# Patient Record
Sex: Female | Born: 1970
Health system: Southern US, Community
[De-identification: ages and names within clinical notes are randomized; demographics above are authoritative.]

## PROBLEM LIST (undated history)

## (undated) DIAGNOSIS — Z87442 Personal history of urinary calculi: Secondary | ICD-10-CM

## (undated) DIAGNOSIS — M1990 Unspecified osteoarthritis, unspecified site: Secondary | ICD-10-CM

## (undated) DIAGNOSIS — G4730 Sleep apnea, unspecified: Secondary | ICD-10-CM

## (undated) DIAGNOSIS — I10 Essential (primary) hypertension: Secondary | ICD-10-CM

## (undated) DIAGNOSIS — Z889 Allergy status to unspecified drugs, medicaments and biological substances status: Secondary | ICD-10-CM

## (undated) DIAGNOSIS — J45909 Unspecified asthma, uncomplicated: Secondary | ICD-10-CM

## (undated) DIAGNOSIS — R519 Headache, unspecified: Secondary | ICD-10-CM

## (undated) DIAGNOSIS — F32A Depression, unspecified: Secondary | ICD-10-CM

## (undated) DIAGNOSIS — F419 Anxiety disorder, unspecified: Secondary | ICD-10-CM

## (undated) HISTORY — PX: NO PAST SURGERIES: SHX2092

---

## 1998-03-19 ENCOUNTER — Encounter: Admission: RE | Admit: 1998-03-19 | Discharge: 1998-06-17 | Payer: Self-pay | Admitting: Family Medicine

## 1998-12-06 ENCOUNTER — Other Ambulatory Visit: Admission: RE | Admit: 1998-12-06 | Discharge: 1998-12-06 | Payer: Self-pay | Admitting: Obstetrics and Gynecology

## 2000-03-25 ENCOUNTER — Other Ambulatory Visit: Admission: RE | Admit: 2000-03-25 | Discharge: 2000-03-25 | Payer: Self-pay | Admitting: Obstetrics and Gynecology

## 2001-03-16 ENCOUNTER — Other Ambulatory Visit: Admission: RE | Admit: 2001-03-16 | Discharge: 2001-03-16 | Payer: Self-pay | Admitting: *Deleted

## 2001-05-05 ENCOUNTER — Emergency Department (HOSPITAL_COMMUNITY): Admission: EM | Admit: 2001-05-05 | Discharge: 2001-05-05 | Payer: Self-pay | Admitting: Emergency Medicine

## 2001-05-12 ENCOUNTER — Encounter (INDEPENDENT_AMBULATORY_CARE_PROVIDER_SITE_OTHER): Payer: Self-pay | Admitting: Specialist

## 2001-05-12 ENCOUNTER — Ambulatory Visit (HOSPITAL_COMMUNITY): Admission: RE | Admit: 2001-05-12 | Discharge: 2001-05-12 | Payer: Self-pay | Admitting: *Deleted

## 2001-06-27 ENCOUNTER — Encounter: Admission: RE | Admit: 2001-06-27 | Discharge: 2001-06-27 | Payer: Self-pay | Admitting: Internal Medicine

## 2001-06-27 ENCOUNTER — Encounter: Payer: Self-pay | Admitting: Internal Medicine

## 2001-07-08 ENCOUNTER — Encounter: Admission: RE | Admit: 2001-07-08 | Discharge: 2001-07-08 | Payer: Self-pay | Admitting: Urology

## 2001-07-08 ENCOUNTER — Encounter: Payer: Self-pay | Admitting: Urology

## 2002-03-17 ENCOUNTER — Other Ambulatory Visit: Admission: RE | Admit: 2002-03-17 | Discharge: 2002-03-17 | Payer: Self-pay | Admitting: Obstetrics and Gynecology

## 2003-06-13 ENCOUNTER — Other Ambulatory Visit: Admission: RE | Admit: 2003-06-13 | Discharge: 2003-06-13 | Payer: Self-pay | Admitting: Gynecology

## 2004-06-23 ENCOUNTER — Other Ambulatory Visit: Admission: RE | Admit: 2004-06-23 | Discharge: 2004-06-23 | Payer: Self-pay | Admitting: Gynecology

## 2005-09-17 ENCOUNTER — Other Ambulatory Visit: Admission: RE | Admit: 2005-09-17 | Discharge: 2005-09-17 | Payer: Self-pay | Admitting: Obstetrics and Gynecology

## 2006-03-09 ENCOUNTER — Inpatient Hospital Stay (HOSPITAL_COMMUNITY): Admission: AD | Admit: 2006-03-09 | Discharge: 2006-03-09 | Payer: Self-pay | Admitting: Obstetrics and Gynecology

## 2006-03-27 ENCOUNTER — Inpatient Hospital Stay (HOSPITAL_COMMUNITY): Admission: AD | Admit: 2006-03-27 | Discharge: 2006-03-29 | Payer: Self-pay | Admitting: Obstetrics and Gynecology

## 2008-05-12 ENCOUNTER — Encounter: Admission: RE | Admit: 2008-05-12 | Discharge: 2008-05-12 | Payer: Self-pay | Admitting: Family Medicine

## 2008-08-17 ENCOUNTER — Ambulatory Visit: Payer: Self-pay

## 2008-08-17 ENCOUNTER — Encounter (INDEPENDENT_AMBULATORY_CARE_PROVIDER_SITE_OTHER): Payer: Self-pay | Admitting: Family Medicine

## 2011-02-06 NOTE — Procedures (Signed)
[place]. [facility]  Patient:    Veronica Meyer, Veronica Meyer Visit Number: [id number] MRN: [id number]          Service Type: END Location: ENDO Attending Physician:  Sabino Gasser Proc. Date: 05/12/01 Adm. Date:  05/12/2001                             Procedure Report  PROCEDURE PERFORMED:  Colonoscopy.  ENDOSCOPIST:  Sabino Gasser, M.D.  INDICATIONS FOR PROCEDURE:  Rectal bleeding colitic symptoms.  ANESTHESIA:  Demerol 100 mg, Versed 10 mg.  DESCRIPTION OF PROCEDURE:  With the patient mildly sedated in the left lateral decubitus position, the Olympus videoscopic colonoscope was inserted in the rectum and passed under direct vision into the cecum.  The cecum was identified by the ileocecal valve and appendiceal orifice, both of which were photographed.  We entered into the terminal ileum which showed inflammatory changes distally, but after a few centimeters, the mucosa became normal.  From this point, the colonoscope was slowly withdrawn, taking circumferential views of the terminal ileum and colonic mucosa stopping first to biopsy in the terminal ileum, subsequently then in the cecum, ascending, descending, rectosigmoid area and rectum areas that were moderately inflamed.  There was a question of pseudomembrane seen in the cecum only.  The endoscope was then further withdrawn all the way to the rectum and on retroflex view this area actually appeared relatively normal although still somewhat edematous.  The endoscope was straightened and withdrawn.  Patients vital signs and pulse oximeter remained stable.  The patient tolerated the procedure well and without apparent complications.  FINDINGS:  Diffuse colitis and ileitis.  PLAN:  Will institute therapy with Flagyl, await biopsy report.  The patient will call me for results and follow-up with me as an outpatient. Attending Physician:  Sabino Gasser DD:  05/12/01 TD:  05/13/01 Job: [id number]

## 2012-10-13 ENCOUNTER — Other Ambulatory Visit (HOSPITAL_COMMUNITY)
Admission: RE | Admit: 2012-10-13 | Discharge: 2012-10-13 | Disposition: A | Discharge status: Home / Self Care | Payer: Managed Care, Other (non HMO) | Source: Ambulatory Visit | Attending: Family Medicine | Admitting: Family Medicine

## 2012-10-13 ENCOUNTER — Other Ambulatory Visit: Payer: Self-pay | Admitting: Family Medicine

## 2012-10-13 DIAGNOSIS — Z1151 Encounter for screening for human papillomavirus (HPV): Secondary | ICD-10-CM | POA: Insufficient documentation

## 2012-10-13 DIAGNOSIS — Z124 Encounter for screening for malignant neoplasm of cervix: Secondary | ICD-10-CM | POA: Insufficient documentation

## 2013-07-26 ENCOUNTER — Other Ambulatory Visit: Payer: Self-pay

## 2013-07-26 DIAGNOSIS — Z1231 Encounter for screening mammogram for malignant neoplasm of breast: Secondary | ICD-10-CM

## 2013-07-31 ENCOUNTER — Ambulatory Visit
Admission: RE | Admit: 2013-07-31 | Discharge: 2013-07-31 | Disposition: A | Discharge status: Home / Self Care | Payer: Managed Care, Other (non HMO) | Source: Ambulatory Visit

## 2013-07-31 DIAGNOSIS — Z1231 Encounter for screening mammogram for malignant neoplasm of breast: Secondary | ICD-10-CM

## 2013-08-04 ENCOUNTER — Other Ambulatory Visit: Payer: Self-pay | Admitting: Family Medicine

## 2013-08-04 DIAGNOSIS — R928 Other abnormal and inconclusive findings on diagnostic imaging of breast: Secondary | ICD-10-CM

## 2013-08-14 ENCOUNTER — Ambulatory Visit
Admission: RE | Admit: 2013-08-14 | Discharge: 2013-08-14 | Disposition: A | Discharge status: Home / Self Care | Payer: Managed Care, Other (non HMO) | Source: Ambulatory Visit | Attending: Family Medicine | Admitting: Family Medicine

## 2013-08-14 DIAGNOSIS — R928 Other abnormal and inconclusive findings on diagnostic imaging of breast: Secondary | ICD-10-CM

## 2014-10-23 ENCOUNTER — Other Ambulatory Visit: Payer: Self-pay

## 2014-10-23 DIAGNOSIS — Z1231 Encounter for screening mammogram for malignant neoplasm of breast: Secondary | ICD-10-CM

## 2014-10-31 ENCOUNTER — Ambulatory Visit: Payer: Managed Care, Other (non HMO)

## 2014-11-01 ENCOUNTER — Ambulatory Visit
Admission: RE | Admit: 2014-11-01 | Discharge: 2014-11-01 | Disposition: A | Discharge status: Home / Self Care | Payer: 59 | Source: Ambulatory Visit

## 2014-11-01 DIAGNOSIS — Z1231 Encounter for screening mammogram for malignant neoplasm of breast: Secondary | ICD-10-CM

## 2015-01-25 ENCOUNTER — Other Ambulatory Visit (HOSPITAL_COMMUNITY)
Admission: RE | Admit: 2015-01-25 | Discharge: 2015-01-25 | Disposition: A | Discharge status: Home / Self Care | Payer: 59 | Source: Ambulatory Visit | Attending: Family Medicine | Admitting: Family Medicine

## 2015-01-25 ENCOUNTER — Other Ambulatory Visit (HOSPITAL_COMMUNITY): Payer: Self-pay | Admitting: Family Medicine

## 2015-01-25 DIAGNOSIS — Z1151 Encounter for screening for human papillomavirus (HPV): Secondary | ICD-10-CM | POA: Diagnosis present

## 2015-01-25 DIAGNOSIS — Z124 Encounter for screening for malignant neoplasm of cervix: Secondary | ICD-10-CM | POA: Insufficient documentation

## 2015-01-28 LAB — CYTOLOGY - PAP

## 2016-05-04 ENCOUNTER — Other Ambulatory Visit: Payer: Self-pay | Admitting: Family Medicine

## 2016-05-04 DIAGNOSIS — Z1231 Encounter for screening mammogram for malignant neoplasm of breast: Secondary | ICD-10-CM

## 2016-05-13 ENCOUNTER — Ambulatory Visit
Admission: RE | Admit: 2016-05-13 | Discharge: 2016-05-13 | Disposition: A | Discharge status: Home / Self Care | Payer: Managed Care, Other (non HMO) | Source: Ambulatory Visit | Attending: Family Medicine | Admitting: Family Medicine

## 2016-05-13 DIAGNOSIS — Z1231 Encounter for screening mammogram for malignant neoplasm of breast: Secondary | ICD-10-CM

## 2017-04-29 ENCOUNTER — Other Ambulatory Visit: Payer: Self-pay | Admitting: Family Medicine

## 2017-04-29 DIAGNOSIS — N938 Other specified abnormal uterine and vaginal bleeding: Secondary | ICD-10-CM

## 2017-05-04 ENCOUNTER — Other Ambulatory Visit: Payer: Self-pay | Admitting: Family Medicine

## 2017-05-07 ENCOUNTER — Ambulatory Visit
Admission: RE | Admit: 2017-05-07 | Discharge: 2017-05-07 | Disposition: A | Discharge status: Home / Self Care | Payer: Managed Care, Other (non HMO) | Source: Ambulatory Visit | Attending: Family Medicine | Admitting: Family Medicine

## 2017-05-07 DIAGNOSIS — N938 Other specified abnormal uterine and vaginal bleeding: Secondary | ICD-10-CM

## 2017-05-27 ENCOUNTER — Other Ambulatory Visit: Payer: Managed Care, Other (non HMO)

## 2017-06-21 ENCOUNTER — Other Ambulatory Visit: Payer: Self-pay | Admitting: Family Medicine

## 2017-06-21 DIAGNOSIS — Z1231 Encounter for screening mammogram for malignant neoplasm of breast: Secondary | ICD-10-CM

## 2017-07-07 ENCOUNTER — Ambulatory Visit
Admission: RE | Admit: 2017-07-07 | Discharge: 2017-07-07 | Disposition: A | Discharge status: Home / Self Care | Payer: Managed Care, Other (non HMO) | Source: Ambulatory Visit | Attending: Family Medicine | Admitting: Family Medicine

## 2017-07-07 DIAGNOSIS — Z1231 Encounter for screening mammogram for malignant neoplasm of breast: Secondary | ICD-10-CM

## 2019-07-28 DIAGNOSIS — Z0000 Encounter for general adult medical examination without abnormal findings: Secondary | ICD-10-CM | POA: Diagnosis not present

## 2019-07-28 DIAGNOSIS — F410 Panic disorder [episodic paroxysmal anxiety] without agoraphobia: Secondary | ICD-10-CM | POA: Diagnosis not present

## 2019-07-28 DIAGNOSIS — F3342 Major depressive disorder, recurrent, in full remission: Secondary | ICD-10-CM | POA: Diagnosis not present

## 2019-07-28 DIAGNOSIS — N951 Menopausal and female climacteric states: Secondary | ICD-10-CM | POA: Diagnosis not present

## 2019-07-31 DIAGNOSIS — Z13220 Encounter for screening for lipoid disorders: Secondary | ICD-10-CM | POA: Diagnosis not present

## 2019-07-31 DIAGNOSIS — E785 Hyperlipidemia, unspecified: Secondary | ICD-10-CM | POA: Diagnosis not present

## 2019-07-31 DIAGNOSIS — Z23 Encounter for immunization: Secondary | ICD-10-CM | POA: Diagnosis not present

## 2019-07-31 DIAGNOSIS — Z131 Encounter for screening for diabetes mellitus: Secondary | ICD-10-CM | POA: Diagnosis not present

## 2020-01-30 DIAGNOSIS — D2261 Melanocytic nevi of right upper limb, including shoulder: Secondary | ICD-10-CM | POA: Diagnosis not present

## 2020-01-30 DIAGNOSIS — D225 Melanocytic nevi of trunk: Secondary | ICD-10-CM | POA: Diagnosis not present

## 2020-01-30 DIAGNOSIS — D2271 Melanocytic nevi of right lower limb, including hip: Secondary | ICD-10-CM | POA: Diagnosis not present

## 2020-01-30 DIAGNOSIS — D2262 Melanocytic nevi of left upper limb, including shoulder: Secondary | ICD-10-CM | POA: Diagnosis not present

## 2020-04-08 ENCOUNTER — Other Ambulatory Visit: Payer: Self-pay | Admitting: Family Medicine

## 2020-04-08 DIAGNOSIS — Z1231 Encounter for screening mammogram for malignant neoplasm of breast: Secondary | ICD-10-CM

## 2020-04-12 ENCOUNTER — Ambulatory Visit
Admission: RE | Admit: 2020-04-12 | Discharge: 2020-04-12 | Disposition: A | Discharge status: Home / Self Care | Payer: BC Managed Care – PPO | Source: Ambulatory Visit | Attending: Family Medicine | Admitting: Family Medicine

## 2020-04-12 ENCOUNTER — Other Ambulatory Visit: Payer: Self-pay

## 2020-04-12 DIAGNOSIS — Z1231 Encounter for screening mammogram for malignant neoplasm of breast: Secondary | ICD-10-CM | POA: Diagnosis not present

## 2020-08-08 DIAGNOSIS — J309 Allergic rhinitis, unspecified: Secondary | ICD-10-CM | POA: Diagnosis not present

## 2020-08-08 DIAGNOSIS — F3342 Major depressive disorder, recurrent, in full remission: Secondary | ICD-10-CM | POA: Diagnosis not present

## 2020-08-08 DIAGNOSIS — Z23 Encounter for immunization: Secondary | ICD-10-CM | POA: Diagnosis not present

## 2020-08-08 DIAGNOSIS — Z0000 Encounter for general adult medical examination without abnormal findings: Secondary | ICD-10-CM | POA: Diagnosis not present

## 2020-08-08 DIAGNOSIS — E785 Hyperlipidemia, unspecified: Secondary | ICD-10-CM | POA: Diagnosis not present

## 2020-08-08 DIAGNOSIS — N951 Menopausal and female climacteric states: Secondary | ICD-10-CM | POA: Diagnosis not present

## 2020-08-08 DIAGNOSIS — F410 Panic disorder [episodic paroxysmal anxiety] without agoraphobia: Secondary | ICD-10-CM | POA: Diagnosis not present

## 2020-08-08 DIAGNOSIS — Z124 Encounter for screening for malignant neoplasm of cervix: Secondary | ICD-10-CM | POA: Diagnosis not present

## 2020-10-22 DIAGNOSIS — D225 Melanocytic nevi of trunk: Secondary | ICD-10-CM | POA: Diagnosis not present

## 2020-10-22 DIAGNOSIS — D224 Melanocytic nevi of scalp and neck: Secondary | ICD-10-CM | POA: Diagnosis not present

## 2020-10-22 DIAGNOSIS — D2239 Melanocytic nevi of other parts of face: Secondary | ICD-10-CM | POA: Diagnosis not present

## 2020-10-22 DIAGNOSIS — D2262 Melanocytic nevi of left upper limb, including shoulder: Secondary | ICD-10-CM | POA: Diagnosis not present

## 2021-03-13 DIAGNOSIS — U071 COVID-19: Secondary | ICD-10-CM | POA: Diagnosis not present

## 2021-04-22 DIAGNOSIS — R030 Elevated blood-pressure reading, without diagnosis of hypertension: Secondary | ICD-10-CM | POA: Diagnosis not present

## 2021-04-22 DIAGNOSIS — Z23 Encounter for immunization: Secondary | ICD-10-CM | POA: Diagnosis not present

## 2021-04-22 DIAGNOSIS — T2220XA Burn of second degree of shoulder and upper limb, except wrist and hand, unspecified site, initial encounter: Secondary | ICD-10-CM | POA: Diagnosis not present

## 2021-04-22 DIAGNOSIS — R0683 Snoring: Secondary | ICD-10-CM | POA: Diagnosis not present

## 2021-04-22 DIAGNOSIS — R400 Somnolence: Secondary | ICD-10-CM | POA: Diagnosis not present

## 2021-05-12 DIAGNOSIS — F410 Panic disorder [episodic paroxysmal anxiety] without agoraphobia: Secondary | ICD-10-CM | POA: Diagnosis not present

## 2021-05-12 DIAGNOSIS — G4719 Other hypersomnia: Secondary | ICD-10-CM | POA: Diagnosis not present

## 2021-05-13 ENCOUNTER — Other Ambulatory Visit: Payer: Self-pay | Admitting: Family Medicine

## 2021-05-13 DIAGNOSIS — Z1231 Encounter for screening mammogram for malignant neoplasm of breast: Secondary | ICD-10-CM

## 2021-05-29 DIAGNOSIS — G4733 Obstructive sleep apnea (adult) (pediatric): Secondary | ICD-10-CM | POA: Diagnosis not present

## 2021-05-29 DIAGNOSIS — E669 Obesity, unspecified: Secondary | ICD-10-CM | POA: Diagnosis not present

## 2021-05-29 DIAGNOSIS — R030 Elevated blood-pressure reading, without diagnosis of hypertension: Secondary | ICD-10-CM | POA: Diagnosis not present

## 2021-06-23 ENCOUNTER — Ambulatory Visit
Admission: RE | Admit: 2021-06-23 | Discharge: 2021-06-23 | Disposition: A | Discharge status: Home / Self Care | Payer: BC Managed Care – PPO | Source: Ambulatory Visit

## 2021-06-23 ENCOUNTER — Other Ambulatory Visit: Payer: Self-pay

## 2021-06-23 DIAGNOSIS — Z1231 Encounter for screening mammogram for malignant neoplasm of breast: Secondary | ICD-10-CM

## 2021-07-12 DIAGNOSIS — S61210A Laceration without foreign body of right index finger without damage to nail, initial encounter: Secondary | ICD-10-CM | POA: Diagnosis not present

## 2021-07-17 DIAGNOSIS — G4733 Obstructive sleep apnea (adult) (pediatric): Secondary | ICD-10-CM | POA: Diagnosis not present

## 2021-07-24 DIAGNOSIS — G4733 Obstructive sleep apnea (adult) (pediatric): Secondary | ICD-10-CM | POA: Diagnosis not present

## 2021-08-17 DIAGNOSIS — G4733 Obstructive sleep apnea (adult) (pediatric): Secondary | ICD-10-CM | POA: Diagnosis not present

## 2021-08-19 DIAGNOSIS — Z0000 Encounter for general adult medical examination without abnormal findings: Secondary | ICD-10-CM | POA: Diagnosis not present

## 2021-08-19 DIAGNOSIS — F410 Panic disorder [episodic paroxysmal anxiety] without agoraphobia: Secondary | ICD-10-CM | POA: Diagnosis not present

## 2021-08-19 DIAGNOSIS — E785 Hyperlipidemia, unspecified: Secondary | ICD-10-CM | POA: Diagnosis not present

## 2021-08-19 DIAGNOSIS — N938 Other specified abnormal uterine and vaginal bleeding: Secondary | ICD-10-CM | POA: Diagnosis not present

## 2021-08-19 DIAGNOSIS — F3341 Major depressive disorder, recurrent, in partial remission: Secondary | ICD-10-CM | POA: Diagnosis not present

## 2021-09-03 DIAGNOSIS — G4733 Obstructive sleep apnea (adult) (pediatric): Secondary | ICD-10-CM | POA: Diagnosis not present

## 2021-09-03 DIAGNOSIS — R030 Elevated blood-pressure reading, without diagnosis of hypertension: Secondary | ICD-10-CM | POA: Diagnosis not present

## 2021-09-16 DIAGNOSIS — G4733 Obstructive sleep apnea (adult) (pediatric): Secondary | ICD-10-CM | POA: Diagnosis not present

## 2021-10-08 DIAGNOSIS — Z79899 Other long term (current) drug therapy: Secondary | ICD-10-CM | POA: Diagnosis not present

## 2021-10-17 DIAGNOSIS — G4733 Obstructive sleep apnea (adult) (pediatric): Secondary | ICD-10-CM | POA: Diagnosis not present

## 2021-11-04 DIAGNOSIS — Z1211 Encounter for screening for malignant neoplasm of colon: Secondary | ICD-10-CM | POA: Diagnosis not present

## 2021-11-05 DIAGNOSIS — G4733 Obstructive sleep apnea (adult) (pediatric): Secondary | ICD-10-CM | POA: Diagnosis not present

## 2021-11-11 DIAGNOSIS — D2372 Other benign neoplasm of skin of left lower limb, including hip: Secondary | ICD-10-CM | POA: Diagnosis not present

## 2021-11-11 DIAGNOSIS — D2262 Melanocytic nevi of left upper limb, including shoulder: Secondary | ICD-10-CM | POA: Diagnosis not present

## 2021-11-11 DIAGNOSIS — L718 Other rosacea: Secondary | ICD-10-CM | POA: Diagnosis not present

## 2021-11-11 DIAGNOSIS — D2261 Melanocytic nevi of right upper limb, including shoulder: Secondary | ICD-10-CM | POA: Diagnosis not present

## 2021-11-17 DIAGNOSIS — G4733 Obstructive sleep apnea (adult) (pediatric): Secondary | ICD-10-CM | POA: Diagnosis not present

## 2022-01-05 DIAGNOSIS — G4733 Obstructive sleep apnea (adult) (pediatric): Secondary | ICD-10-CM | POA: Diagnosis not present

## 2022-01-14 DIAGNOSIS — I10 Essential (primary) hypertension: Secondary | ICD-10-CM | POA: Diagnosis not present

## 2022-01-14 DIAGNOSIS — E669 Obesity, unspecified: Secondary | ICD-10-CM | POA: Diagnosis not present

## 2022-01-14 DIAGNOSIS — Z6834 Body mass index (BMI) 34.0-34.9, adult: Secondary | ICD-10-CM | POA: Diagnosis not present

## 2022-01-14 DIAGNOSIS — F3342 Major depressive disorder, recurrent, in full remission: Secondary | ICD-10-CM | POA: Diagnosis not present

## 2022-03-16 DIAGNOSIS — G4733 Obstructive sleep apnea (adult) (pediatric): Secondary | ICD-10-CM | POA: Diagnosis not present

## 2022-03-16 DIAGNOSIS — E669 Obesity, unspecified: Secondary | ICD-10-CM | POA: Diagnosis not present

## 2022-04-02 DIAGNOSIS — F3342 Major depressive disorder, recurrent, in full remission: Secondary | ICD-10-CM | POA: Diagnosis not present

## 2022-04-02 DIAGNOSIS — I10 Essential (primary) hypertension: Secondary | ICD-10-CM | POA: Diagnosis not present

## 2022-04-02 DIAGNOSIS — R0602 Shortness of breath: Secondary | ICD-10-CM | POA: Diagnosis not present

## 2022-04-02 DIAGNOSIS — G4733 Obstructive sleep apnea (adult) (pediatric): Secondary | ICD-10-CM | POA: Diagnosis not present

## 2022-04-02 DIAGNOSIS — F17210 Nicotine dependence, cigarettes, uncomplicated: Secondary | ICD-10-CM | POA: Diagnosis not present

## 2022-04-27 DIAGNOSIS — M533 Sacrococcygeal disorders, not elsewhere classified: Secondary | ICD-10-CM | POA: Diagnosis not present

## 2022-04-27 DIAGNOSIS — M5416 Radiculopathy, lumbar region: Secondary | ICD-10-CM | POA: Diagnosis not present

## 2022-04-28 DIAGNOSIS — F322 Major depressive disorder, single episode, severe without psychotic features: Secondary | ICD-10-CM | POA: Diagnosis not present

## 2022-04-28 DIAGNOSIS — F17210 Nicotine dependence, cigarettes, uncomplicated: Secondary | ICD-10-CM | POA: Diagnosis not present

## 2022-04-28 DIAGNOSIS — I10 Essential (primary) hypertension: Secondary | ICD-10-CM | POA: Diagnosis not present

## 2022-04-28 DIAGNOSIS — E669 Obesity, unspecified: Secondary | ICD-10-CM | POA: Diagnosis not present

## 2022-05-05 DIAGNOSIS — M545 Low back pain, unspecified: Secondary | ICD-10-CM | POA: Diagnosis not present

## 2022-05-05 DIAGNOSIS — M25559 Pain in unspecified hip: Secondary | ICD-10-CM | POA: Diagnosis not present

## 2022-05-20 DIAGNOSIS — G4733 Obstructive sleep apnea (adult) (pediatric): Secondary | ICD-10-CM | POA: Diagnosis not present

## 2022-06-04 DIAGNOSIS — M545 Low back pain, unspecified: Secondary | ICD-10-CM | POA: Diagnosis not present

## 2022-06-04 DIAGNOSIS — M533 Sacrococcygeal disorders, not elsewhere classified: Secondary | ICD-10-CM | POA: Diagnosis not present

## 2022-06-16 ENCOUNTER — Other Ambulatory Visit: Payer: Self-pay | Admitting: Otolaryngology

## 2022-06-22 ENCOUNTER — Encounter (HOSPITAL_COMMUNITY): Payer: Self-pay | Admitting: Otolaryngology

## 2022-06-23 ENCOUNTER — Encounter (HOSPITAL_COMMUNITY): Payer: Self-pay | Admitting: Otolaryngology

## 2022-06-23 ENCOUNTER — Other Ambulatory Visit: Payer: Self-pay

## 2022-06-23 NOTE — Progress Notes (Signed)
PCP - Dr Theadore Nan Cardiologist - nn/a  Chest x-ray - n/a EKG - n/a Stress Test - n/a ECHO - 08/17/08 Cardiac Cath - n/a  ICD Pacemaker/Loop - n.a  Sleep Study -  Yes CPAP - does not use CPAP  ERAS: Clear liquids til 7:30 AM DOS.  STOP now taking any Aspirin (unless otherwise instructed by your surgeon), Aleve, Naproxen, Ibuprofen, Motrin, Advil, Goody's, BC's, all herbal medications, fish oil, and all vitamins.   Coronavirus Screening Do you have any of the following symptoms:  Cough yes/no: No Fever (>100.79F)  yes/no: No Runny nose yes/no: No Sore throat yes/no: No Difficulty breathing/shortness of breath  yes/no: No  Have you traveled in the last 14 days and where? yes/no: No  Patient verbalized understanding of instructions that were given via phone.

## 2022-06-24 ENCOUNTER — Ambulatory Visit (HOSPITAL_COMMUNITY): Payer: BC Managed Care – PPO | Admitting: Anesthesiology

## 2022-06-24 ENCOUNTER — Ambulatory Visit (HOSPITAL_COMMUNITY)
Admission: RE | Admit: 2022-06-24 | Discharge: 2022-06-24 | Disposition: A | Discharge status: Home / Self Care | Payer: BC Managed Care – PPO | Attending: Otolaryngology | Admitting: Otolaryngology

## 2022-06-24 ENCOUNTER — Encounter (HOSPITAL_COMMUNITY): Payer: Self-pay | Admitting: Otolaryngology

## 2022-06-24 ENCOUNTER — Encounter (HOSPITAL_COMMUNITY)
Admission: RE | Disposition: A | Discharge status: Home / Self Care | Payer: Self-pay | Source: Home / Self Care | Attending: Otolaryngology

## 2022-06-24 DIAGNOSIS — Z79899 Other long term (current) drug therapy: Secondary | ICD-10-CM | POA: Diagnosis not present

## 2022-06-24 DIAGNOSIS — F17210 Nicotine dependence, cigarettes, uncomplicated: Secondary | ICD-10-CM | POA: Diagnosis not present

## 2022-06-24 DIAGNOSIS — G4733 Obstructive sleep apnea (adult) (pediatric): Secondary | ICD-10-CM | POA: Diagnosis not present

## 2022-06-24 DIAGNOSIS — F418 Other specified anxiety disorders: Secondary | ICD-10-CM | POA: Insufficient documentation

## 2022-06-24 DIAGNOSIS — I10 Essential (primary) hypertension: Secondary | ICD-10-CM | POA: Diagnosis not present

## 2022-06-24 DIAGNOSIS — Z6832 Body mass index (BMI) 32.0-32.9, adult: Secondary | ICD-10-CM | POA: Insufficient documentation

## 2022-06-24 DIAGNOSIS — K219 Gastro-esophageal reflux disease without esophagitis: Secondary | ICD-10-CM | POA: Insufficient documentation

## 2022-06-24 DIAGNOSIS — J45990 Exercise induced bronchospasm: Secondary | ICD-10-CM | POA: Insufficient documentation

## 2022-06-24 DIAGNOSIS — E669 Obesity, unspecified: Secondary | ICD-10-CM | POA: Diagnosis not present

## 2022-06-24 HISTORY — DX: Personal history of urinary calculi: Z87.442

## 2022-06-24 HISTORY — DX: Unspecified osteoarthritis, unspecified site: M19.90

## 2022-06-24 HISTORY — DX: Essential (primary) hypertension: I10

## 2022-06-24 HISTORY — DX: Anxiety disorder, unspecified: F41.9

## 2022-06-24 HISTORY — DX: Unspecified asthma, uncomplicated: J45.909

## 2022-06-24 HISTORY — DX: Depression, unspecified: F32.A

## 2022-06-24 HISTORY — DX: Allergy status to unspecified drugs, medicaments and biological substances: Z88.9

## 2022-06-24 HISTORY — DX: Headache, unspecified: R51.9

## 2022-06-24 HISTORY — PX: DRUG INDUCED ENDOSCOPY: SHX6808

## 2022-06-24 HISTORY — DX: Sleep apnea, unspecified: G47.30

## 2022-06-24 LAB — CBC
HCT: 41.3 % (ref 36.0–46.0)
Hemoglobin: 14 g/dL (ref 12.0–15.0)
MCH: 31.7 pg (ref 26.0–34.0)
MCHC: 33.9 g/dL (ref 30.0–36.0)
MCV: 93.7 fL (ref 80.0–100.0)
Platelets: 240 10*3/uL (ref 150–400)
RBC: 4.41 MIL/uL (ref 3.87–5.11)
RDW: 13.6 % (ref 11.5–15.5)
WBC: 6.1 10*3/uL (ref 4.0–10.5)
nRBC: 0 % (ref 0.0–0.2)

## 2022-06-24 LAB — POCT PREGNANCY, URINE: Preg Test, Ur: NEGATIVE

## 2022-06-24 LAB — COMPREHENSIVE METABOLIC PANEL
ALT: 11 U/L (ref 0–44)
AST: 18 U/L (ref 15–41)
Albumin: 3.6 g/dL (ref 3.5–5.0)
Alkaline Phosphatase: 50 U/L (ref 38–126)
Anion gap: 11 (ref 5–15)
BUN: 10 mg/dL (ref 6–20)
CO2: 22 mmol/L (ref 22–32)
Calcium: 9.2 mg/dL (ref 8.9–10.3)
Chloride: 108 mmol/L (ref 98–111)
Creatinine, Ser: 0.81 mg/dL (ref 0.44–1.00)
GFR, Estimated: >60 mL/min (ref >60)
Glucose, Bld: 104 mg/dL — ABNORMAL HIGH (ref 70–99)
Potassium: 3.8 mmol/L (ref 3.5–5.1)
Sodium: 141 mmol/L (ref 135–145)
Total Bilirubin: 0.7 mg/dL (ref 0.3–1.2)
Total Protein: 6.9 g/dL (ref 6.5–8.1)

## 2022-06-24 SURGERY — DRUG INDUCED SLEEP ENDOSCOPY
Anesthesia: Monitor Anesthesia Care | Site: Nose

## 2022-06-24 MED ORDER — PROPOFOL 500 MG/50ML IV EMUL
INTRAVENOUS | Status: DC | PRN
  Administered 2022-06-24: 50 ug/kg/min via INTRAVENOUS

## 2022-06-24 MED ORDER — MEPERIDINE HCL 25 MG/ML IJ SOLN: 6.2500 mg | INTRAMUSCULAR | Status: DC | PRN

## 2022-06-24 MED ORDER — PROPOFOL 1000 MG/100ML IV EMUL
INTRAVENOUS | Status: AC
  Filled 2022-06-24: qty 100

## 2022-06-24 MED ORDER — LACTATED RINGERS IV SOLN: INTRAVENOUS | Status: DC

## 2022-06-24 MED ORDER — LIDOCAINE-EPINEPHRINE 1 %-1:100000 IJ SOLN
INTRAMUSCULAR | Status: AC
  Filled 2022-06-24: qty 1

## 2022-06-24 MED ORDER — OXYCODONE HCL 5 MG PO TABS: 5.0000 mg | ORAL_TABLET | Freq: Once | ORAL | Status: DC | PRN

## 2022-06-24 MED ORDER — MIDAZOLAM HCL 2 MG/2ML IJ SOLN: 0.5000 mg | Freq: Once | INTRAMUSCULAR | Status: DC | PRN

## 2022-06-24 MED ORDER — FENTANYL CITRATE (PF) 100 MCG/2ML IJ SOLN: 25.0000 ug | INTRAMUSCULAR | Status: DC | PRN

## 2022-06-24 MED ORDER — CHLORHEXIDINE GLUCONATE 0.12 % MT SOLN
15.0000 mL | Freq: Once | OROMUCOSAL | Status: AC
  Administered 2022-06-24: 15 mL via OROMUCOSAL
  Filled 2022-06-24: qty 15

## 2022-06-24 MED ORDER — ORAL CARE MOUTH RINSE: 15.0000 mL | Freq: Once | OROMUCOSAL | Status: AC

## 2022-06-24 MED ORDER — PROPOFOL 10 MG/ML IV BOLUS
INTRAVENOUS | Status: DC | PRN
  Administered 2022-06-24 (×2): 20 mg via INTRAVENOUS

## 2022-06-24 MED ORDER — OXYMETAZOLINE HCL 0.05 % NA SOLN
NASAL | Status: AC
  Filled 2022-06-24: qty 30

## 2022-06-24 MED ORDER — OXYCODONE HCL 5 MG/5ML PO SOLN: 5.0000 mg | Freq: Once | ORAL | Status: DC | PRN

## 2022-06-24 MED ORDER — PROMETHAZINE HCL 25 MG/ML IJ SOLN: 6.2500 mg | INTRAMUSCULAR | Status: DC | PRN

## 2022-06-24 MED ORDER — MIDAZOLAM HCL 2 MG/2ML IJ SOLN
INTRAMUSCULAR | Status: AC
  Filled 2022-06-24: qty 2

## 2022-06-24 SURGICAL SUPPLY — 15 items
BAG COUNTER SPONGE SURGICOUNT (BAG) IMPLANT
BAG SPNG CNTER NS LX DISP (BAG)
CANISTER SUCT 1200ML W/VALVE (MISCELLANEOUS) ×2 IMPLANT
GLOVE BIOGEL M 7.0 STRL (GLOVE) ×2 IMPLANT
KIT BASIN OR (CUSTOM PROCEDURE TRAY) ×2 IMPLANT
NDL PRECISIONGLIDE 27X1.5 (NEEDLE) IMPLANT
NEEDLE PRECISIONGLIDE 27X1.5 (NEEDLE) IMPLANT
PATTIES SURGICAL .5 X3 (DISPOSABLE) IMPLANT
SHEET MEDIUM DRAPE 40X70 STRL (DRAPES) ×2 IMPLANT
SOL ANTI FOG 6CC (MISCELLANEOUS) ×2 IMPLANT
SOLUTION ANTI FOG 6CC (MISCELLANEOUS) ×1
SPONGE NEURO XRAY DETECT 1X3 (DISPOSABLE) IMPLANT
SYR CONTROL 10ML LL (SYRINGE) IMPLANT
TOWEL GREEN STERILE FF (TOWEL DISPOSABLE) ×2 IMPLANT
TUBE CONNECTING 20X1/4 (TUBING) IMPLANT

## 2022-06-24 NOTE — Anesthesia Postprocedure Evaluation (Signed)
Anesthesia Post Note  Patient: Veronica Meyer  Procedure(s) Performed: DRUG INDUCED SLEEP ENDOSCOPY (Nose)     Patient location during evaluation: PACU Anesthesia Type: MAC Level of consciousness: awake and alert, patient cooperative and oriented Pain management: pain level controlled Vital Signs Assessment: post-procedure vital signs reviewed and stable Respiratory status: spontaneous breathing, nonlabored ventilation and respiratory function stable Cardiovascular status: stable and blood pressure returned to baseline Postop Assessment: no apparent nausea or vomiting and able to ambulate Anesthetic complications: no   No notable events documented.  Last Vitals:  Vitals:   06/24/22 1100 06/24/22 1115  BP: (!) 102/47 (!) 118/57  Pulse: 66 (!) 57  Resp: 20 17  Temp: 36.6 C 36.6 C  SpO2: 98% 97%    Last Pain:  Vitals:   06/24/22 1100  TempSrc:   PainSc: 0-No pain                 Ernestene Coover,E. Irene Mitcham

## 2022-06-24 NOTE — H&P (Signed)
[address] Veronica Meyer is an 51 y.o. female.   Chief Complaint: Obstructive sleep apnea HPI: History of moderately severe obstructive sleep apnea, unable to tolerate CPAP for long-term management.  Past Medical History:  Diagnosis Date   Anxiety    Arthritis    Asthma    exercise induced asthma - no inhaler   Depression    Headache    History of kidney stones    passed stones   History of seasonal allergies    Hypertension    Sleep apnea    does not use cpap    Past Surgical History:  Procedure Laterality Date   NO PAST SURGERIES      Family History  Problem Relation Age of Onset   Breast cancer Maternal Grandmother    Social History:  reports that she has been smoking cigarettes. She has a 8.50 pack-year smoking history. She has never used smokeless tobacco. She reports current alcohol use of about 9.0 standard drinks of alcohol per week. She reports current drug use.  Allergies:  Allergies  Allergen Reactions   Penicillins Rash    Childhood reaction.   Ambien [Zolpidem]     Paradoxical reaction   Pneumococcal Vaccines Hives and Other (See Comments)    Hives/"sensation of throat swelling up"    Medications Prior to Admission  Medication Sig Dispense Refill   acetaminophen (TYLENOL) 500 MG tablet Take 500-1,000 mg by mouth every 6 (six) hours as needed (pain.).     cetirizine (ZYRTEC) 10 MG tablet Take 10 mg by mouth in the morning.     clonazePAM (KLONOPIN) 0.5 MG tablet Take 0.5 mg by mouth at bedtime as needed (sleep).     diclofenac Sodium (VOLTAREN) 1 % GEL Apply 2 g topically 4 (four) times daily as needed (pain.).     fluticasone (FLONASE) 50 MCG/ACT nasal spray Place 1 spray into both nostrils daily in the afternoon.     losartan-hydrochlorothiazide (HYZAAR) 50-12.5 MG tablet Take 1 tablet by mouth in the morning.     omeprazole (PRILOSEC) 20 MG capsule Take 20 mg by mouth daily.     sertraline (ZOLOFT) 100 MG tablet Take 100 mg by mouth in the morning.       Results for orders placed or performed during the hospital encounter of 06/24/22 (from the past 48 hour(s))  CBC per protocol     Status: None   Collection Time: 06/24/22  8:42 AM  Result Value Ref Range   WBC 6.1 4.0 - 10.5 K/uL   RBC 4.41 3.87 - 5.11 MIL/uL   Hemoglobin 14.0 12.0 - 15.0 g/dL   HCT 41.3 36.0 - 46.0 %   MCV 93.7 80.0 - 100.0 fL   MCH 31.7 26.0 - 34.0 pg   MCHC 33.9 30.0 - 36.0 g/dL   RDW 13.6 11.5 - 15.5 %   Platelets 240 150 - 400 K/uL   nRBC 0.0 0.0 - 0.2 %    Comment: Performed at [facility], [place] [address] [place], [place] [address]  Comprehensive metabolic panel per protocol     Status: Abnormal   Collection Time: 06/24/22  8:42 AM  Result Value Ref Range   Sodium 141 135 - 145 mmol/L   Potassium 3.8 3.5 - 5.1 mmol/L   Chloride 108 98 - 111 mmol/L   CO2 22 22 - 32 mmol/L   Glucose, Bld 104 (H) 70 - 99 mg/dL    Comment: Glucose reference range applies only to samples taken  after fasting for at least 8 hours.   BUN 10 6 - 20 mg/dL   Creatinine, Ser 0.81 0.44 - 1.00 mg/dL   Calcium 9.2 8.9 - 10.3 mg/dL   Total Protein 6.9 6.5 - 8.1 g/dL   Albumin 3.6 3.5 - 5.0 g/dL   AST 18 15 - 41 U/L   ALT 11 0 - 44 U/L   Alkaline Phosphatase 50 38 - 126 U/L   Total Bilirubin 0.7 0.3 - 1.2 mg/dL   GFR, Estimated >60 >60 mL/min    Comment: (NOTE) Calculated using the CKD-EPI Creatinine Equation (2021)    Anion gap 11 5 - 15    Comment: Performed at [facility] [address] [place], [place] [address]  Pregnancy, urine POC     Status: None   Collection Time: 06/24/22  8:44 AM  Result Value Ref Range   Preg Test, Ur NEGATIVE NEGATIVE    Comment:        THE SENSITIVITY OF THIS METHODOLOGY IS >24 mIU/mL    No results found.  Review of Systems  Constitutional: Negative.   Respiratory:  Positive for apnea.     Blood pressure 119/77, pulse 75, temperature 98.1 F (36.7 C), temperature source Oral, resp. rate 18, height 5\' 3"  (1.6 m), weight  83.9 kg, last menstrual period 05/14/2022, SpO2 96 %. Physical Exam Constitutional:      Appearance: Normal appearance.  Cardiovascular:     Rate and Rhythm: Normal rate.  Pulmonary:     Effort: Pulmonary effort is normal.  Musculoskeletal:     Cervical back: Normal range of motion.  Neurological:     Mental Status: She is alert.      Assessment/Plan Patient admitted for outpatient surgery under general anesthesia: Drug-induced sleep endoscopy.  Jerrell Belfast, MD 06/24/2022, 10:05 AM

## 2022-06-24 NOTE — Op Note (Signed)
Operative Note: DRUG INDUCED SLEEP ENDOSCOPY  Patient: Veronica Meyer record number: [id number]  Date:06/24/2022  Pre-operative Indications: 1.  Obstructive Sleep Apnea  Postoperative Indications: Same  Surgical Procedure: 1.  Drug Induced Sleep Endoscopy (DISE)  Anesthesia: MAC with IV sedation  Surgeon: Delsa Bern, M.D.  Complications: None  BMI: 32.7 kg/m  EBL: None  Findings: There is no evidence of complete concentric palatal obstruction.  Anatomically the patient should be a candidate for hypoglossal nerve stimulation therapy.     Brief History: The patient is a 51 y.o. female with a history of obstructive sleep apnea. The patient has undergone previous sleep study which showed mild to moderate levels of obstructive sleep apnea.  The patient was prescribed CPAP which they consistently attempted to use without success.  Given the patient's history and findings, the above drug-induced sleep endoscopy was recommended to assess the patient's anatomic level of apnea.  Risks and benefits were discussed in detail with the patient today understand and agree with our plan for surgery which is scheduled at [facility] under sedated anesthesia as an outpatient.  Surgical Procedure: The patient is brought to the operating room on 06/24/2022 and placed in supine position on the operating table.  Intravenous sedated anesthesia was established without difficulty using the standard drug-induced sleep endoscopy protocol. When the patient was adequately anesthetized, surgical timeout was performed and correct identification of the patient and the surgical procedure.    A propofol infusion was administered and the patient was monitored carefully to achieve a level of sedation appropriate for DISE.  The patient did not respond to verbal commands but still had spontaneous respiration, sleep disordered breathing and associated desaturations were observed.  With the patient  under adequate sedated anesthesia the flexible nasal laryngoscope was passed without difficulty.  The patient's nasal cavity showed deviated nasal septum and turbinate hypertrophy with partial obstruction.  The endoscope was then passed to visualize the velopharynx, oropharynx, tongue base and epiglottis to assess areas of obstruction.  Patient's airway showed anterior to posterior obstruction of the patient's soft palate and base of tongue.   There was no evidence of complete concentric palatal obstruction and the patient appeared to be a candidate anatomically for hypoglossal nerve stimulation therapy.  Surgical sponge count was correct. Patient was awakened from anesthetic and transferred from the operating room to the recovery room in stable condition. There were no complications and no blood loss.   Delsa Bern, M.D. [facility] 06/24/2022

## 2022-06-24 NOTE — Transfer of Care (Signed)
Immediate Anesthesia Transfer of Care Note  Patient: Veronica Meyer  Procedure(s) Performed: DRUG INDUCED SLEEP ENDOSCOPY (Nose)  Patient Location: PACU  Anesthesia Type:MAC  Level of Consciousness: awake, alert , oriented, and patient cooperative  Airway & Oxygen Therapy: Patient Spontanous Breathing and Patient connected to nasal cannula oxygen  Post-op Assessment: Report given to RN, Post -op Vital signs reviewed and stable, and Patient moving all extremities X 4  Post vital signs: Reviewed and stable  Last Vitals:  Vitals Value Taken Time  BP    Temp    Pulse 66 06/24/22 1100  Resp    SpO2 89 % 06/24/22 1100  Vitals shown include unvalidated device data.  Last Pain:  Vitals:   06/24/22 0832  TempSrc:   PainSc: 0-No pain         Complications: No notable events documented.

## 2022-06-24 NOTE — Anesthesia Preprocedure Evaluation (Addendum)
Anesthesia Evaluation  Patient identified by MRN, date of birth, ID band Patient awake    Reviewed: Allergy & Precautions, NPO status , Patient's Chart, lab work & pertinent test results  History of Anesthesia Complications Negative for: history of anesthetic complications  Airway Mallampati: II  TM Distance: >3 FB Neck ROM: Full    Dental  (+) Dental Advisory Given, Teeth Intact   Pulmonary sleep apnea (does not use CPAP) , Current Smoker and Patient abstained from smoking.,    breath sounds clear to auscultation       Cardiovascular hypertension, Pt. on medications (-) angina Rhythm:Regular Rate:Normal  '09 ECHO: EF 55-60%, normal LVF, normal RVF, no significant valvular abnormalities   Neuro/Psych  Headaches, Anxiety Depression    GI/Hepatic Neg liver ROS, GERD  Medicated and Controlled,  Endo/Other  BMI 32.8  Renal/GU negative Renal ROS     Musculoskeletal   Abdominal (+) + obese,   Peds  Hematology negative hematology ROS (+)   Anesthesia Other Findings   Reproductive/Obstetrics                            Anesthesia Physical Anesthesia Plan  ASA: 3  Anesthesia Plan: MAC   Post-op Pain Management: Minimal or no pain anticipated   Induction:   PONV Risk Score and Plan: 1 and Ondansetron  Airway Management Planned: Natural Airway  Additional Equipment: None  Intra-op Plan:   Post-operative Plan:   Informed Consent: I have reviewed the patients History and Physical, chart, labs and discussed the procedure including the risks, benefits and alternatives for the proposed anesthesia with the patient or authorized representative who has indicated his/her understanding and acceptance.     Dental advisory given  Plan Discussed with: CRNA and Surgeon  Anesthesia Plan Comments:        Anesthesia Quick Evaluation

## 2022-06-24 NOTE — Anesthesia Procedure Notes (Addendum)
Date/Time: 06/24/2022 10:41 AM  Performed by: Michele Rockers, CRNAPre-anesthesia Checklist: Patient identified, Emergency Drugs available, Suction available, Timeout performed and Patient being monitored Patient Re-evaluated:Patient Re-evaluated prior to induction Oxygen Delivery Method: Nasal Cannula

## 2022-06-25 ENCOUNTER — Encounter (HOSPITAL_COMMUNITY): Payer: Self-pay | Admitting: Otolaryngology

## 2022-06-25 DIAGNOSIS — M5459 Other low back pain: Secondary | ICD-10-CM | POA: Diagnosis not present

## 2022-06-29 DIAGNOSIS — M5459 Other low back pain: Secondary | ICD-10-CM | POA: Diagnosis not present

## 2022-07-03 DIAGNOSIS — M5459 Other low back pain: Secondary | ICD-10-CM | POA: Diagnosis not present

## 2022-07-06 DIAGNOSIS — M5459 Other low back pain: Secondary | ICD-10-CM | POA: Diagnosis not present

## 2022-07-13 DIAGNOSIS — M5459 Other low back pain: Secondary | ICD-10-CM | POA: Diagnosis not present

## 2022-07-15 DIAGNOSIS — M5459 Other low back pain: Secondary | ICD-10-CM | POA: Diagnosis not present

## 2022-07-20 DIAGNOSIS — M5459 Other low back pain: Secondary | ICD-10-CM | POA: Diagnosis not present

## 2022-07-24 ENCOUNTER — Other Ambulatory Visit: Payer: Self-pay | Admitting: Family Medicine

## 2022-07-24 DIAGNOSIS — Z1231 Encounter for screening mammogram for malignant neoplasm of breast: Secondary | ICD-10-CM

## 2022-07-24 DIAGNOSIS — M533 Sacrococcygeal disorders, not elsewhere classified: Secondary | ICD-10-CM | POA: Diagnosis not present

## 2022-08-27 DIAGNOSIS — F3342 Major depressive disorder, recurrent, in full remission: Secondary | ICD-10-CM | POA: Diagnosis not present

## 2022-08-27 DIAGNOSIS — G4733 Obstructive sleep apnea (adult) (pediatric): Secondary | ICD-10-CM | POA: Diagnosis not present

## 2022-08-27 DIAGNOSIS — F410 Panic disorder [episodic paroxysmal anxiety] without agoraphobia: Secondary | ICD-10-CM | POA: Diagnosis not present

## 2022-08-27 DIAGNOSIS — I10 Essential (primary) hypertension: Secondary | ICD-10-CM | POA: Diagnosis not present

## 2022-08-27 DIAGNOSIS — E785 Hyperlipidemia, unspecified: Secondary | ICD-10-CM | POA: Diagnosis not present

## 2022-08-27 DIAGNOSIS — Z0000 Encounter for general adult medical examination without abnormal findings: Secondary | ICD-10-CM | POA: Diagnosis not present

## 2022-08-27 DIAGNOSIS — N938 Other specified abnormal uterine and vaginal bleeding: Secondary | ICD-10-CM | POA: Diagnosis not present

## 2022-08-27 DIAGNOSIS — Z1159 Encounter for screening for other viral diseases: Secondary | ICD-10-CM | POA: Diagnosis not present

## 2022-08-27 DIAGNOSIS — Z23 Encounter for immunization: Secondary | ICD-10-CM | POA: Diagnosis not present

## 2022-09-02 DIAGNOSIS — G4733 Obstructive sleep apnea (adult) (pediatric): Secondary | ICD-10-CM | POA: Diagnosis not present

## 2022-09-03 DIAGNOSIS — M533 Sacrococcygeal disorders, not elsewhere classified: Secondary | ICD-10-CM | POA: Diagnosis not present

## 2022-09-03 DIAGNOSIS — N83202 Unspecified ovarian cyst, left side: Secondary | ICD-10-CM | POA: Diagnosis not present

## 2022-09-03 DIAGNOSIS — D251 Intramural leiomyoma of uterus: Secondary | ICD-10-CM | POA: Diagnosis not present

## 2022-09-03 DIAGNOSIS — N938 Other specified abnormal uterine and vaginal bleeding: Secondary | ICD-10-CM | POA: Diagnosis not present

## 2022-09-03 DIAGNOSIS — D252 Subserosal leiomyoma of uterus: Secondary | ICD-10-CM | POA: Diagnosis not present

## 2022-09-28 ENCOUNTER — Ambulatory Visit
Admission: RE | Admit: 2022-09-28 | Discharge: 2022-09-28 | Disposition: A | Discharge status: Home / Self Care | Payer: BC Managed Care – PPO | Source: Ambulatory Visit | Attending: Family Medicine | Admitting: Family Medicine

## 2022-09-28 DIAGNOSIS — Z1231 Encounter for screening mammogram for malignant neoplasm of breast: Secondary | ICD-10-CM

## 2022-09-30 DIAGNOSIS — M533 Sacrococcygeal disorders, not elsewhere classified: Secondary | ICD-10-CM | POA: Diagnosis not present

## 2022-10-07 ENCOUNTER — Other Ambulatory Visit: Payer: Self-pay | Admitting: Otolaryngology

## 2022-10-07 DIAGNOSIS — Z6832 Body mass index (BMI) 32.0-32.9, adult: Secondary | ICD-10-CM | POA: Diagnosis not present

## 2022-10-07 DIAGNOSIS — G4733 Obstructive sleep apnea (adult) (pediatric): Secondary | ICD-10-CM | POA: Diagnosis not present

## 2022-11-16 ENCOUNTER — Other Ambulatory Visit: Payer: Self-pay

## 2022-11-16 ENCOUNTER — Encounter (HOSPITAL_BASED_OUTPATIENT_CLINIC_OR_DEPARTMENT_OTHER): Payer: Self-pay | Admitting: Otolaryngology

## 2022-11-19 ENCOUNTER — Encounter (HOSPITAL_BASED_OUTPATIENT_CLINIC_OR_DEPARTMENT_OTHER)
Admission: RE | Admit: 2022-11-19 | Discharge: 2022-11-19 | Disposition: A | Discharge status: Home / Self Care | Payer: BC Managed Care – PPO | Source: Ambulatory Visit | Attending: Otolaryngology | Admitting: Otolaryngology

## 2022-11-19 DIAGNOSIS — Z79899 Other long term (current) drug therapy: Secondary | ICD-10-CM | POA: Insufficient documentation

## 2022-11-19 DIAGNOSIS — Z01812 Encounter for preprocedural laboratory examination: Secondary | ICD-10-CM | POA: Diagnosis not present

## 2022-11-19 LAB — BASIC METABOLIC PANEL
Anion gap: 11 (ref 5–15)
BUN: 12 mg/dL (ref 6–20)
CO2: 25 mmol/L (ref 22–32)
Calcium: 9.8 mg/dL (ref 8.9–10.3)
Chloride: 100 mmol/L (ref 98–111)
Creatinine, Ser: 0.87 mg/dL (ref 0.44–1.00)
GFR, Estimated: >60 mL/min (ref >60)
Glucose, Bld: 86 mg/dL (ref 70–99)
Potassium: 4.4 mmol/L (ref 3.5–5.1)
Sodium: 136 mmol/L (ref 135–145)

## 2022-11-19 NOTE — Progress Notes (Signed)

## 2022-11-24 ENCOUNTER — Other Ambulatory Visit: Payer: Self-pay

## 2022-11-24 ENCOUNTER — Ambulatory Visit (HOSPITAL_BASED_OUTPATIENT_CLINIC_OR_DEPARTMENT_OTHER): Payer: BC Managed Care – PPO | Admitting: Certified Registered"

## 2022-11-24 ENCOUNTER — Ambulatory Visit (HOSPITAL_COMMUNITY): Payer: BC Managed Care – PPO

## 2022-11-24 ENCOUNTER — Encounter (HOSPITAL_BASED_OUTPATIENT_CLINIC_OR_DEPARTMENT_OTHER): Payer: Self-pay | Admitting: Otolaryngology

## 2022-11-24 ENCOUNTER — Encounter (HOSPITAL_BASED_OUTPATIENT_CLINIC_OR_DEPARTMENT_OTHER)
Admission: RE | Disposition: A | Discharge status: Home / Self Care | Payer: Self-pay | Source: Ambulatory Visit | Attending: Otolaryngology

## 2022-11-24 ENCOUNTER — Ambulatory Visit (HOSPITAL_BASED_OUTPATIENT_CLINIC_OR_DEPARTMENT_OTHER)
Admission: RE | Admit: 2022-11-24 | Discharge: 2022-11-24 | Disposition: A | Discharge status: Home / Self Care | Payer: BC Managed Care – PPO | Source: Ambulatory Visit | Attending: Otolaryngology | Admitting: Otolaryngology

## 2022-11-24 DIAGNOSIS — F32A Depression, unspecified: Secondary | ICD-10-CM | POA: Insufficient documentation

## 2022-11-24 DIAGNOSIS — Z6832 Body mass index (BMI) 32.0-32.9, adult: Secondary | ICD-10-CM | POA: Insufficient documentation

## 2022-11-24 DIAGNOSIS — Z79899 Other long term (current) drug therapy: Secondary | ICD-10-CM | POA: Diagnosis not present

## 2022-11-24 DIAGNOSIS — J45909 Unspecified asthma, uncomplicated: Secondary | ICD-10-CM | POA: Insufficient documentation

## 2022-11-24 DIAGNOSIS — F17200 Nicotine dependence, unspecified, uncomplicated: Secondary | ICD-10-CM | POA: Diagnosis not present

## 2022-11-24 DIAGNOSIS — F17210 Nicotine dependence, cigarettes, uncomplicated: Secondary | ICD-10-CM | POA: Diagnosis not present

## 2022-11-24 DIAGNOSIS — K219 Gastro-esophageal reflux disease without esophagitis: Secondary | ICD-10-CM | POA: Insufficient documentation

## 2022-11-24 DIAGNOSIS — I10 Essential (primary) hypertension: Secondary | ICD-10-CM | POA: Diagnosis not present

## 2022-11-24 DIAGNOSIS — Z01818 Encounter for other preprocedural examination: Secondary | ICD-10-CM

## 2022-11-24 DIAGNOSIS — M1990 Unspecified osteoarthritis, unspecified site: Secondary | ICD-10-CM | POA: Diagnosis not present

## 2022-11-24 DIAGNOSIS — G4733 Obstructive sleep apnea (adult) (pediatric): Secondary | ICD-10-CM | POA: Diagnosis not present

## 2022-11-24 DIAGNOSIS — F419 Anxiety disorder, unspecified: Secondary | ICD-10-CM | POA: Insufficient documentation

## 2022-11-24 HISTORY — PX: IMPLANTATION OF HYPOGLOSSAL NERVE STIMULATOR: SHX6827

## 2022-11-24 LAB — POCT PREGNANCY, URINE: Preg Test, Ur: NEGATIVE

## 2022-11-24 SURGERY — INSERTION, HYPOGLOSSAL NERVE STIMULATOR
Anesthesia: General | Site: Neck | Laterality: Right

## 2022-11-24 MED ORDER — VANCOMYCIN HCL IN DEXTROSE 1-5 GM/200ML-% IV SOLN
INTRAVENOUS | Status: AC
  Filled 2022-11-24: qty 200

## 2022-11-24 MED ORDER — SCOPOLAMINE 1 MG/3DAYS TD PT72
MEDICATED_PATCH | TRANSDERMAL | Status: DC | PRN
  Administered 2022-11-24: 1 via TRANSDERMAL

## 2022-11-24 MED ORDER — AMISULPRIDE (ANTIEMETIC) 5 MG/2ML IV SOLN: 10.0000 mg | Freq: Once | INTRAVENOUS | Status: DC | PRN

## 2022-11-24 MED ORDER — 0.9 % SODIUM CHLORIDE (POUR BTL) OPTIME
TOPICAL | Status: DC | PRN
  Administered 2022-11-24: 200 mL

## 2022-11-24 MED ORDER — ONDANSETRON HCL 4 MG/2ML IJ SOLN: 4.0000 mg | Freq: Once | INTRAMUSCULAR | Status: DC | PRN

## 2022-11-24 MED ORDER — ONDANSETRON HCL 4 MG/2ML IJ SOLN
INTRAMUSCULAR | Status: DC | PRN
  Administered 2022-11-24: 4 mg via INTRAVENOUS

## 2022-11-24 MED ORDER — MIDAZOLAM HCL 5 MG/5ML IJ SOLN
INTRAMUSCULAR | Status: DC | PRN
  Administered 2022-11-24: 2 mg via INTRAVENOUS

## 2022-11-24 MED ORDER — OXYCODONE HCL 5 MG PO TABS
5.0000 mg | ORAL_TABLET | Freq: Once | ORAL | Status: AC | PRN
  Administered 2022-11-24: 5 mg via ORAL

## 2022-11-24 MED ORDER — PHENYLEPHRINE HCL-NACL 20-0.9 MG/250ML-% IV SOLN
INTRAVENOUS | Status: DC | PRN
  Administered 2022-11-24: 160 ug via INTRAVENOUS
  Administered 2022-11-24: 20 ug/min via INTRAVENOUS

## 2022-11-24 MED ORDER — ACETAMINOPHEN 500 MG PO TABS
1000.0000 mg | ORAL_TABLET | Freq: Once | ORAL | Status: AC
  Administered 2022-11-24: 1000 mg via ORAL

## 2022-11-24 MED ORDER — LIDOCAINE-EPINEPHRINE 1 %-1:100000 IJ SOLN
INTRAMUSCULAR | Status: AC
  Filled 2022-11-24: qty 1

## 2022-11-24 MED ORDER — PROPOFOL 500 MG/50ML IV EMUL
INTRAVENOUS | Status: DC | PRN
  Administered 2022-11-24: 50 ug/kg/min via INTRAVENOUS

## 2022-11-24 MED ORDER — DEXAMETHASONE SODIUM PHOSPHATE 4 MG/ML IJ SOLN
INTRAMUSCULAR | Status: DC | PRN
  Administered 2022-11-24: 4 mg via INTRAVENOUS

## 2022-11-24 MED ORDER — HYDROCODONE-ACETAMINOPHEN 5-325 MG PO TABS: 1.0000 | ORAL_TABLET | Freq: Four times a day (QID) | ORAL | 0 refills | Status: AC | PRN

## 2022-11-24 MED ORDER — ACETAMINOPHEN 500 MG PO TABS
ORAL_TABLET | ORAL | Status: AC
  Filled 2022-11-24: qty 2

## 2022-11-24 MED ORDER — PROPOFOL 500 MG/50ML IV EMUL
INTRAVENOUS | Status: AC
  Filled 2022-11-24: qty 50

## 2022-11-24 MED ORDER — OXYCODONE HCL 5 MG/5ML PO SOLN: 5.0000 mg | Freq: Once | ORAL | Status: AC | PRN

## 2022-11-24 MED ORDER — LIDOCAINE-EPINEPHRINE 1 %-1:100000 IJ SOLN
INTRAMUSCULAR | Status: DC | PRN
  Administered 2022-11-24: 6 mL

## 2022-11-24 MED ORDER — LACTATED RINGERS IV SOLN: INTRAVENOUS | Status: DC

## 2022-11-24 MED ORDER — DEXMEDETOMIDINE HCL IN NACL 80 MCG/20ML IV SOLN
INTRAVENOUS | Status: DC | PRN
  Administered 2022-11-24: 12 ug via BUCCAL

## 2022-11-24 MED ORDER — HYDROMORPHONE HCL 1 MG/ML IJ SOLN
0.2500 mg | INTRAMUSCULAR | Status: DC | PRN
  Administered 2022-11-24 (×2): 0.25 mg via INTRAVENOUS

## 2022-11-24 MED ORDER — VANCOMYCIN HCL IN DEXTROSE 1-5 GM/200ML-% IV SOLN
1000.0000 mg | INTRAVENOUS | Status: AC
  Administered 2022-11-24: 1000 mg via INTRAVENOUS

## 2022-11-24 MED ORDER — FENTANYL CITRATE (PF) 100 MCG/2ML IJ SOLN
INTRAMUSCULAR | Status: AC
  Filled 2022-11-24: qty 2

## 2022-11-24 MED ORDER — PROPOFOL 10 MG/ML IV BOLUS
INTRAVENOUS | Status: DC | PRN
  Administered 2022-11-24: 200 mg via INTRAVENOUS

## 2022-11-24 MED ORDER — SCOPOLAMINE 1 MG/3DAYS TD PT72
MEDICATED_PATCH | TRANSDERMAL | Status: AC
  Filled 2022-11-24: qty 1

## 2022-11-24 MED ORDER — HYDROMORPHONE HCL 1 MG/ML IJ SOLN
INTRAMUSCULAR | Status: AC
  Filled 2022-11-24: qty 0.5

## 2022-11-24 MED ORDER — OXYCODONE HCL 5 MG PO TABS
ORAL_TABLET | ORAL | Status: AC
  Filled 2022-11-24: qty 1

## 2022-11-24 MED ORDER — FENTANYL CITRATE (PF) 100 MCG/2ML IJ SOLN
INTRAMUSCULAR | Status: DC | PRN
  Administered 2022-11-24: 50 ug via INTRAVENOUS
  Administered 2022-11-24: 25 ug via INTRAVENOUS
  Administered 2022-11-24: 50 ug via INTRAVENOUS
  Administered 2022-11-24: 25 ug via INTRAVENOUS
  Administered 2022-11-24: 50 ug via INTRAVENOUS

## 2022-11-24 MED ORDER — SUCCINYLCHOLINE CHLORIDE 200 MG/10ML IV SOSY
PREFILLED_SYRINGE | INTRAVENOUS | Status: DC | PRN
  Administered 2022-11-24: 120 mg via INTRAVENOUS

## 2022-11-24 MED ORDER — LIDOCAINE 2% (20 MG/ML) 5 ML SYRINGE
INTRAMUSCULAR | Status: DC | PRN
  Administered 2022-11-24: 60 mg via INTRAVENOUS

## 2022-11-24 MED ORDER — MIDAZOLAM HCL 2 MG/2ML IJ SOLN
INTRAMUSCULAR | Status: AC
  Filled 2022-11-24: qty 2

## 2022-11-24 SURGICAL SUPPLY — 71 items
ACC NRSTM 4 TRQ WRNCH STRL (MISCELLANEOUS)
ADH SKN CLS APL DERMABOND .7 (GAUZE/BANDAGES/DRESSINGS) ×2
BLADE CLIPPER SURG (BLADE) IMPLANT
BLADE SURG 15 STRL LF DISP TIS (BLADE) ×2 IMPLANT
BLADE SURG 15 STRL SS (BLADE) ×1
CANISTER SUCT 1200ML W/VALVE (MISCELLANEOUS) ×2 IMPLANT
CORD BIPOLAR FORCEPS 12FT (ELECTRODE) ×2 IMPLANT
COVER PROBE CYLINDRICAL 5X96 (MISCELLANEOUS) ×2 IMPLANT
DERMABOND ADVANCED .7 DNX12 (GAUZE/BANDAGES/DRESSINGS) ×4 IMPLANT
DRAPE C-ARM 35X43 STRL (DRAPES) ×2 IMPLANT
DRAPE HEAD BAR (DRAPES) IMPLANT
DRAPE INCISE IOBAN 66X45 STRL (DRAPES) ×2 IMPLANT
DRAPE MICROSCOPE WILD 40.5X102 (DRAPES) ×2 IMPLANT
DRAPE UTILITY XL STRL (DRAPES) ×2 IMPLANT
DRSG TEGADERM 2-3/8X2-3/4 SM (GAUZE/BANDAGES/DRESSINGS) ×4 IMPLANT
DRSG TEGADERM 4X4.75 (GAUZE/BANDAGES/DRESSINGS) IMPLANT
ELECT COATED BLADE 2.86 ST (ELECTRODE) ×2 IMPLANT
ELECT EMG 18 NIMS (NEUROSURGERY SUPPLIES) ×1
ELECT REM PT RETURN 9FT ADLT (ELECTROSURGICAL) ×1
ELECTRODE EMG 18 NIMS (NEUROSURGERY SUPPLIES) ×2 IMPLANT
ELECTRODE REM PT RTRN 9FT ADLT (ELECTROSURGICAL) ×2 IMPLANT
FORCEPS BIPOLAR SPETZLER 8 1.0 (NEUROSURGERY SUPPLIES) ×2 IMPLANT
GAUZE 4X4 16PLY ~~LOC~~+RFID DBL (SPONGE) ×2 IMPLANT
GAUZE SPONGE 4X4 12PLY STRL (GAUZE/BANDAGES/DRESSINGS) ×2 IMPLANT
GENERATOR PULSE INSPIRE (Generator) ×1 IMPLANT
GENERATOR PULSE INSPIRE IV (Generator) ×2 IMPLANT
GLOVE BIO SURGEON STRL SZ 6.5 (GLOVE) IMPLANT
GLOVE BIO SURGEON STRL SZ7 (GLOVE) IMPLANT
GLOVE BIO SURGEON STRL SZ7.5 (GLOVE) ×2 IMPLANT
GLOVE BIOGEL PI IND STRL 7.0 (GLOVE) IMPLANT
GLOVE ECLIPSE 6.5 STRL STRAW (GLOVE) IMPLANT
GOWN STRL REUS W/ TWL LRG LVL3 (GOWN DISPOSABLE) ×6 IMPLANT
GOWN STRL REUS W/TWL LRG LVL3 (GOWN DISPOSABLE) ×4
IV CATH 18G SAFETY (IV SOLUTION) ×2 IMPLANT
KIT NEURO ACCESSORY W/WRENCH (MISCELLANEOUS) IMPLANT
LEAD SENSING RESP INSPIRE (Lead) ×1 IMPLANT
LEAD SENSING RESP INSPIRE IV (Lead) ×2 IMPLANT
LEAD SLEEP STIM INSPIRE IV/V (Lead) ×2 IMPLANT
LEAD SLEEP STIMULATION INSPIRE (Lead) ×1 IMPLANT
LOOP VASCLR MAXI BLUE 18IN ST (MISCELLANEOUS) ×2 IMPLANT
LOOP VASCULAR MAXI 18 BLUE (MISCELLANEOUS) ×1
LOOP VASCULAR MINI 18 RED (MISCELLANEOUS) ×1
LOOPS VASCLR MAXI BLUE 18IN ST (MISCELLANEOUS) ×1 IMPLANT
MARKER SKIN DUAL TIP RULER LAB (MISCELLANEOUS) ×2 IMPLANT
NDL HYPO 25X1 1.5 SAFETY (NEEDLE) ×2 IMPLANT
NEEDLE HYPO 25X1 1.5 SAFETY (NEEDLE) ×1 IMPLANT
NS IRRIG 1000ML POUR BTL (IV SOLUTION) ×2 IMPLANT
PACK BASIN DAY SURGERY FS (CUSTOM PROCEDURE TRAY) ×2 IMPLANT
PACK ENT DAY SURGERY (CUSTOM PROCEDURE TRAY) ×2 IMPLANT
PASSER CATH 36 CODMAN DISP (NEUROSURGERY SUPPLIES) IMPLANT
PASSER CATH 38CM DISP (INSTRUMENTS) IMPLANT
PENCIL SMOKE EVACUATOR (MISCELLANEOUS) ×2 IMPLANT
PROBE NERVE STIMULATOR (NEUROSURGERY SUPPLIES) ×2 IMPLANT
REMOTE CONTROL SLEEP INSPIRE (MISCELLANEOUS) ×2 IMPLANT
SET WALTER ACTIVATION W/DRAPE (SET/KITS/TRAYS/PACK) ×2 IMPLANT
SLEEVE SCD COMPRESS KNEE MED (STOCKING) ×2 IMPLANT
SPONGE INTESTINAL PEANUT (DISPOSABLE) ×2 IMPLANT
SUT SILK 2 0 SH (SUTURE) ×2 IMPLANT
SUT SILK 3 0 REEL (SUTURE) ×2 IMPLANT
SUT SILK 3 0 SH 30 (SUTURE) ×2 IMPLANT
SUT SILK 3-0 (SUTURE) ×1
SUT SILK 3-0 RB1 30XBRD (SUTURE) ×1
SUT VIC AB 3-0 SH 27 (SUTURE) ×2
SUT VIC AB 3-0 SH 27X BRD (SUTURE) ×4 IMPLANT
SUT VIC AB 4-0 PS2 27 (SUTURE) ×4 IMPLANT
SUTURE SILK 3-0 RB1 30XBRD (SUTURE) ×2 IMPLANT
SYR 10ML LL (SYRINGE) ×2 IMPLANT
SYR BULB EAR ULCER 3OZ GRN STR (SYRINGE) ×2 IMPLANT
TOWEL GREEN STERILE FF (TOWEL DISPOSABLE) ×4 IMPLANT
VASCULAR TIE MAXI BLUE 18IN ST (MISCELLANEOUS) ×1
VASCULAR TIE MINI RED 18IN STL (MISCELLANEOUS) ×2 IMPLANT

## 2022-11-24 NOTE — Transfer of Care (Signed)
Immediate Anesthesia Transfer of Care Note  Patient: Atley Cardone  Procedure(s) Performed: IMPLANTATION OF HYPOGLOSSAL NERVE STIMULATOR (Right: Neck)  Patient Location: PACU  Anesthesia Type:General  Level of Consciousness: awake, alert , oriented, and patient cooperative  Airway & Oxygen Therapy: Patient Spontanous Breathing and Patient connected to face mask oxygen  Post-op Assessment: Report given to RN and Post -op Vital signs reviewed and stable  Post vital signs: Reviewed and stable  Last Vitals:  Vitals Value Taken Time  BP    Temp    Pulse 93 11/24/22 1413  Resp 11 11/24/22 1413  SpO2 98 % 11/24/22 1413  Vitals shown include unvalidated device data.  Last Pain:  Vitals:   11/24/22 1047  TempSrc: Oral  PainSc: 0-No pain      Patients Stated Pain Goal: 3 ([id number])  Complications: No notable events documented.

## 2022-11-24 NOTE — Anesthesia Procedure Notes (Signed)
Procedure Name: Intubation Date/Time: 11/24/2022 12:20 PM  Performed by: Signe Colt, CRNAPre-anesthesia Checklist: Patient identified, Emergency Drugs available, Suction available and Patient being monitored Patient Re-evaluated:Patient Re-evaluated prior to induction Oxygen Delivery Method: Circle system utilized Preoxygenation: Pre-oxygenation with 100% oxygen Induction Type: IV induction Ventilation: Mask ventilation without difficulty Laryngoscope Size: Mac and 3 Grade View: Grade II Tube type: Oral Tube size: 7.0 mm Number of attempts: 1 Airway Equipment and Method: Stylet and Oral airway Placement Confirmation: ETT inserted through vocal cords under direct vision, positive ETCO2 and breath sounds checked- equal and bilateral Secured at: 21 cm Tube secured with: Tape Dental Injury: Teeth and Oropharynx as per pre-operative assessment

## 2022-11-24 NOTE — H&P (Signed)
[address] Veronica Meyer is an 52 y.o. female.   Chief Complaint: Sleep apnea HPI: 52 year old female with obstructive sleep apnea who has been unable to tolerate CPAP.  Past Medical History:  Diagnosis Date   Anxiety    Arthritis    Asthma    exercise induced asthma - no inhaler   Depression    Headache    History of kidney stones    passed stones   History of seasonal allergies    Hypertension    Sleep apnea    does not use cpap    Past Surgical History:  Procedure Laterality Date   DRUG INDUCED ENDOSCOPY N/A 06/24/2022   Procedure: DRUG INDUCED SLEEP ENDOSCOPY;  Surgeon: Jerrell Belfast, MD;  Location: [facility];  Service: ENT;  Laterality: N/A;   NO PAST SURGERIES      Family History  Problem Relation Age of Onset   Breast cancer Maternal Grandmother    Social History:  reports that she has been smoking cigarettes. She has a 8.50 pack-year smoking history. She quit smokeless tobacco use yesterday. She reports current alcohol use of about 9.0 standard drinks of alcohol per week. She reports that she does not use drugs.  Allergies:  Allergies  Allergen Reactions   Penicillins Rash    Childhood reaction.   Ambien [Zolpidem]     Paradoxical reaction   Pneumococcal Vaccines Hives and Other (See Comments)    Hives/"sensation of throat swelling up"    Medications Prior to Admission  Medication Sig Dispense Refill   acetaminophen (TYLENOL) 500 MG tablet Take 500-1,000 mg by mouth every 6 (six) hours as needed (pain.).     cetirizine (ZYRTEC) 10 MG tablet Take 10 mg by mouth in the morning.     losartan-hydrochlorothiazide (HYZAAR) 50-12.5 MG tablet Take 1 tablet by mouth in the morning.     omeprazole (PRILOSEC) 20 MG capsule Take 20 mg by mouth daily.     sertraline (ZOLOFT) 100 MG tablet Take 100 mg by mouth in the morning.     clonazePAM (KLONOPIN) 0.5 MG tablet Take 0.5 mg by mouth at bedtime as needed (sleep).     diclofenac Sodium (VOLTAREN) 1 % GEL Apply 2 g topically 4  (four) times daily as needed (pain.).     fluticasone (FLONASE) 50 MCG/ACT nasal spray Place 1 spray into both nostrils daily in the afternoon.      Results for orders placed or performed during the hospital encounter of 11/24/22 (from the past 48 hour(s))  Pregnancy, urine POC     Status: None   Collection Time: 11/24/22 10:43 AM  Result Value Ref Range   Preg Test, Ur NEGATIVE NEGATIVE    Comment:        THE SENSITIVITY OF THIS METHODOLOGY IS >24 mIU/mL    No results found.  Review of Systems  All other systems reviewed and are negative.   Blood pressure (!) 139/100, pulse 95, temperature (!) 97.1 F (36.2 C), temperature source Oral, resp. rate 20, height 5' 3.5" (1.613 m), weight 85.5 kg, last menstrual period 11/19/2022, SpO2 99 %. Physical Exam Constitutional:      Appearance: Normal appearance. She is normal weight.  HENT:     Head: Normocephalic and atraumatic.     Right Ear: External ear normal.     Left Ear: External ear normal.     Nose: Nose normal.     Mouth/Throat:     Mouth: Mucous membranes are moist.  Pharynx: Oropharynx is clear.  Eyes:     Extraocular Movements: Extraocular movements intact.     Conjunctiva/sclera: Conjunctivae normal.     Pupils: Pupils are equal, round, and reactive to light.  Cardiovascular:     Rate and Rhythm: Normal rate.  Pulmonary:     Effort: Pulmonary effort is normal.  Musculoskeletal:     Cervical back: Normal range of motion.  Skin:    General: Skin is warm and dry.  Neurological:     General: No focal deficit present.     Mental Status: She is alert and oriented to person, place, and time.  Psychiatric:        Mood and Affect: Mood normal.        Behavior: Behavior normal.        Thought Content: Thought content normal.        Judgment: Judgment normal.      Assessment/Plan Obstructive sleep apnea and BMI 32.87  To OR for hypoglossal nerve stimulator placement.  Melida Quitter, MD 11/24/2022, 12:03  PM

## 2022-11-24 NOTE — Anesthesia Preprocedure Evaluation (Addendum)
Anesthesia Evaluation  Patient identified by MRN, date of birth, ID band Patient awake    Reviewed: Allergy & Precautions, NPO status , Patient's Chart, lab work & pertinent test results  Airway Mallampati: III  TM Distance: >3 FB Neck ROM: Full  Mouth opening: Pediatric Airway  Dental no notable dental hx. (+) Teeth Intact, Dental Advisory Given   Pulmonary neg pulmonary ROS, asthma (exercise induced, well controlled) , sleep apnea , Current Smoker and Patient abstained from smoking. 10 cigg/d   Pulmonary exam normal breath sounds clear to auscultation       Cardiovascular hypertension (139/100 preop, normally slightly lower), Pt. on medications negative cardio ROS Normal cardiovascular exam Rhythm:Regular Rate:Normal     Neuro/Psych  Headaches PSYCHIATRIC DISORDERS Anxiety Depression    negative neurological ROS  negative psych ROS   GI/Hepatic negative GI ROS, Neg liver ROS,GERD  Medicated and Controlled,,  Endo/Other  negative endocrine ROS    Renal/GU negative Renal ROS  negative genitourinary   Musculoskeletal negative musculoskeletal ROS (+) Arthritis , Osteoarthritis,    Abdominal Normal abdominal exam  (+)   Peds negative pediatric ROS (+)  Hematology negative hematology ROS (+)   Anesthesia Other Findings   Reproductive/Obstetrics negative OB ROS                             Anesthesia Physical Anesthesia Plan  ASA: 3  Anesthesia Plan: General   Post-op Pain Management: Tylenol PO (pre-op)*   Induction: Intravenous  PONV Risk Score and Plan: 2 and Ondansetron, Dexamethasone, Midazolam, Treatment may vary due to age or medical condition, TIVA and Propofol infusion  Airway Management Planned: Oral ETT  Additional Equipment: None  Intra-op Plan:   Post-operative Plan: Extubation in OR  Informed Consent: I have reviewed the patients History and Physical, chart, labs and  discussed the procedure including the risks, benefits and alternatives for the proposed anesthesia with the patient or authorized representative who has indicated his/her understanding and acceptance.     Dental advisory given  Plan Discussed with: CRNA  Anesthesia Plan Comments:        Anesthesia Quick Evaluation

## 2022-11-24 NOTE — Anesthesia Postprocedure Evaluation (Signed)
Anesthesia Post Note  Patient: Veronica Meyer  Procedure(s) Performed: IMPLANTATION OF HYPOGLOSSAL NERVE STIMULATOR (Right: Neck)     Patient location during evaluation: PACU Anesthesia Type: General Level of consciousness: awake and alert, oriented and patient cooperative Pain management: pain level controlled Vital Signs Assessment: post-procedure vital signs reviewed and stable Respiratory status: spontaneous breathing, nonlabored ventilation and respiratory function stable Cardiovascular status: blood pressure returned to baseline and stable Postop Assessment: no apparent nausea or vomiting Anesthetic complications: no   No notable events documented.  Last Vitals:  Vitals:   11/24/22 1445 11/24/22 1500  BP: 134/75 131/63  Pulse: 73 78  Resp: 12 16  Temp:    SpO2: 94% 94%    Last Pain:  Vitals:   11/24/22 1500  TempSrc:   PainSc: [place]

## 2022-11-24 NOTE — Discharge Instructions (Signed)
No Tylenol until after 5:00pm today.  Post Anesthesia Home Care Instructions  Activity: Get plenty of rest for the remainder of the day. A responsible individual must stay with you for 24 hours following the procedure.  For the next 24 hours, DO NOT: -Drive a car -Paediatric nurse -Drink alcoholic beverages -Take any medication unless instructed by your physician -Make any legal decisions or sign important papers.  Meals: Start with liquid foods such as gelatin or soup. Progress to regular foods as tolerated. Avoid greasy, spicy, heavy foods. If nausea and/or vomiting occur, drink only clear liquids until the nausea and/or vomiting subsides. Call your physician if vomiting continues.  Special Instructions/Symptoms: Your throat may feel dry or sore from the anesthesia or the breathing tube placed in your throat during surgery. If this causes discomfort, gargle with warm salt water. The discomfort should disappear within 24 hours.  If you had a scopolamine patch placed behind your ear for the management of post- operative nausea and/or vomiting:  1. The medication in the patch is effective for 72 hours, after which it should be removed.  Wrap patch in a tissue and discard in the trash. Wash hands thoroughly with soap and water. 2. You may remove the patch earlier than 72 hours if you experience unpleasant side effects which may include dry mouth, dizziness or visual disturbances. 3. Avoid touching the patch. Wash your hands with soap and water after contact with the patch.

## 2022-11-24 NOTE — Brief Op Note (Signed)
11/24/2022  1:59 PM  PATIENT:  Veronica Meyer  52 y.o. female  PRE-OPERATIVE DIAGNOSIS:  Obstructive Sleep Apnea BMI 32.0-32.9,adult  POST-OPERATIVE DIAGNOSIS:  Obstructive Sleep Apnea  PROCEDURE:  Procedure(s): IMPLANTATION OF HYPOGLOSSAL NERVE STIMULATOR (Right)  SURGEON:  Surgeon(s) and Role:    Melida Quitter, MD - Primary  PHYSICIAN ASSISTANT:   ASSISTANTS: RNFA   ANESTHESIA:   general  EBL:  Minimal   BLOOD ADMINISTERED:none  DRAINS: none   LOCAL MEDICATIONS USED:  LIDOCAINE   SPECIMEN:  No Specimen  DISPOSITION OF SPECIMEN:  N/A  COUNTS:  YES  TOURNIQUET:  * No tourniquets in log *  DICTATION: .Note written in EPIC  PLAN OF CARE: Discharge to home after PACU  PATIENT DISPOSITION:  PACU - hemodynamically stable.   Delay start of Pharmacological VTE agent (>24hrs) due to surgical blood loss or risk of bleeding: no

## 2022-11-24 NOTE — Op Note (Signed)

## 2022-11-25 ENCOUNTER — Encounter (HOSPITAL_BASED_OUTPATIENT_CLINIC_OR_DEPARTMENT_OTHER): Payer: Self-pay | Admitting: Otolaryngology

## 2022-12-02 DIAGNOSIS — G4733 Obstructive sleep apnea (adult) (pediatric): Secondary | ICD-10-CM | POA: Diagnosis not present

## 2022-12-22 DIAGNOSIS — Z1211 Encounter for screening for malignant neoplasm of colon: Secondary | ICD-10-CM | POA: Diagnosis not present

## 2022-12-24 DIAGNOSIS — D2272 Melanocytic nevi of left lower limb, including hip: Secondary | ICD-10-CM | POA: Diagnosis not present

## 2022-12-24 DIAGNOSIS — L858 Other specified epidermal thickening: Secondary | ICD-10-CM | POA: Diagnosis not present

## 2022-12-24 DIAGNOSIS — D2261 Melanocytic nevi of right upper limb, including shoulder: Secondary | ICD-10-CM | POA: Diagnosis not present

## 2022-12-24 DIAGNOSIS — D225 Melanocytic nevi of trunk: Secondary | ICD-10-CM | POA: Diagnosis not present

## 2022-12-28 DIAGNOSIS — M533 Sacrococcygeal disorders, not elsewhere classified: Secondary | ICD-10-CM | POA: Diagnosis not present

## 2022-12-31 DIAGNOSIS — G4733 Obstructive sleep apnea (adult) (pediatric): Secondary | ICD-10-CM | POA: Diagnosis not present

## 2023-01-26 DIAGNOSIS — M533 Sacrococcygeal disorders, not elsewhere classified: Secondary | ICD-10-CM | POA: Diagnosis not present

## 2023-01-28 DIAGNOSIS — G4733 Obstructive sleep apnea (adult) (pediatric): Secondary | ICD-10-CM | POA: Diagnosis not present

## 2023-03-01 DIAGNOSIS — G4730 Sleep apnea, unspecified: Secondary | ICD-10-CM | POA: Diagnosis not present

## 2023-03-01 DIAGNOSIS — F3342 Major depressive disorder, recurrent, in full remission: Secondary | ICD-10-CM | POA: Diagnosis not present

## 2023-03-01 DIAGNOSIS — I10 Essential (primary) hypertension: Secondary | ICD-10-CM | POA: Diagnosis not present

## 2023-03-01 DIAGNOSIS — R5383 Other fatigue: Secondary | ICD-10-CM | POA: Diagnosis not present

## 2023-03-09 DIAGNOSIS — E669 Obesity, unspecified: Secondary | ICD-10-CM | POA: Diagnosis not present

## 2023-03-09 DIAGNOSIS — G4733 Obstructive sleep apnea (adult) (pediatric): Secondary | ICD-10-CM | POA: Diagnosis not present

## 2023-04-23 IMAGING — MG MM DIGITAL SCREENING BILAT W/ TOMO AND CAD
8 series · 8 of 24 positions shown · non-contrast
Comparison: Previous exam(s).

CLINICAL DATA: Screening.

EXAM:
DIGITAL SCREENING BILATERAL MAMMOGRAM WITH TOMOSYNTHESIS AND CAD
TECHNIQUE: Bilateral screening digital craniocaudal and mediolateral oblique
mammograms were obtained. Bilateral screening digital breast
tomosynthesis was performed. The images were evaluated with
computer-aided detection.

[R MLO synth-2D]
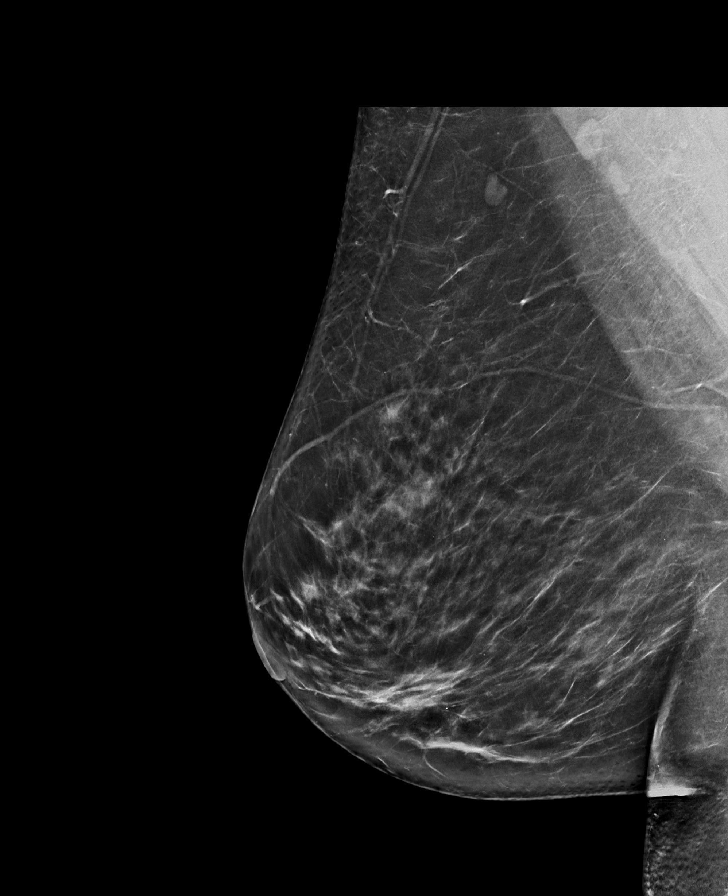

[R CC synth-2D]
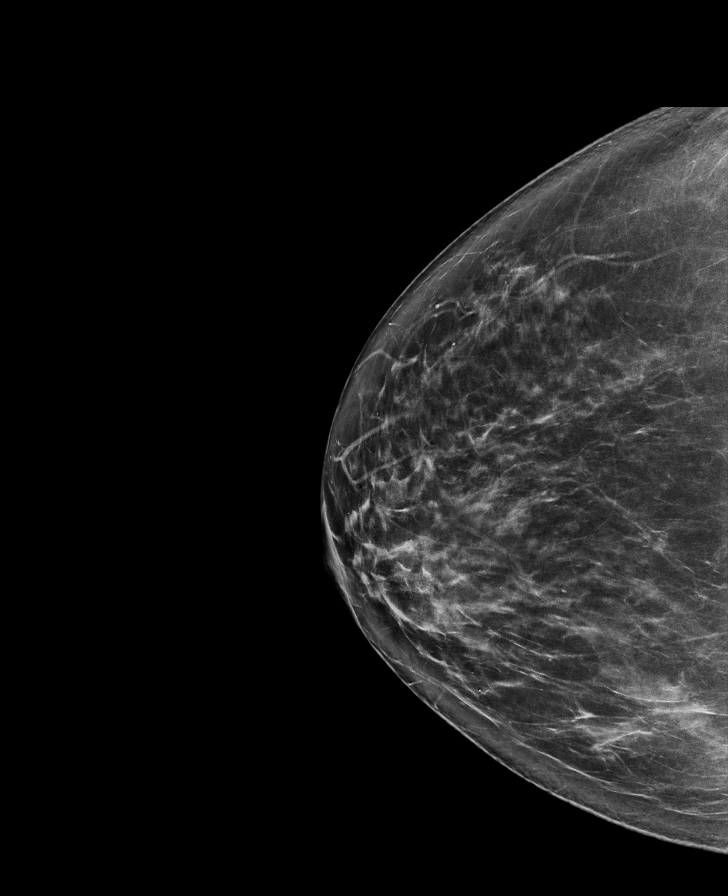

[L MLO synth-2D]
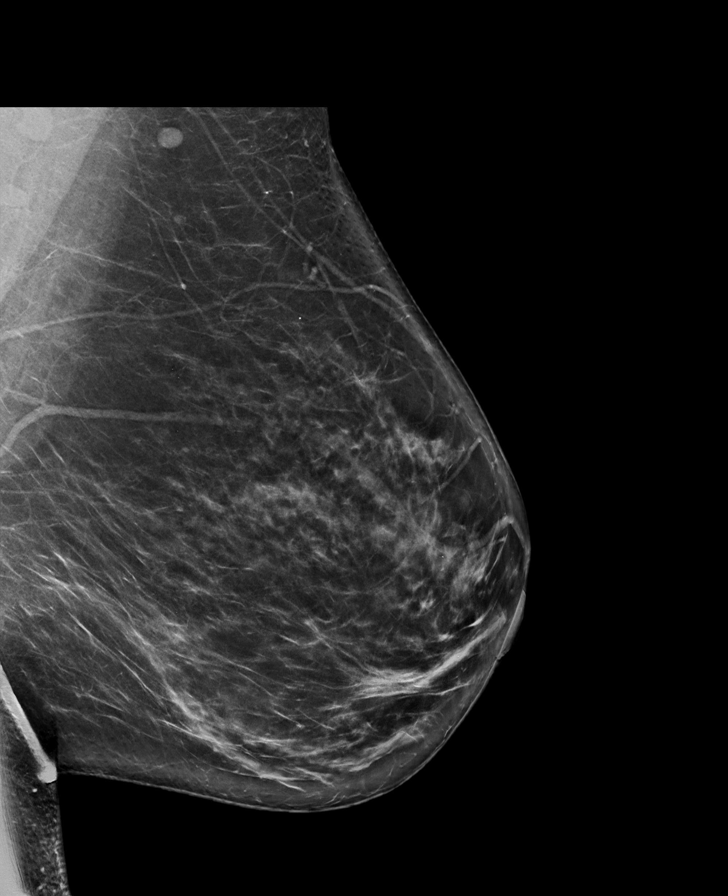

[L CC synth-2D]
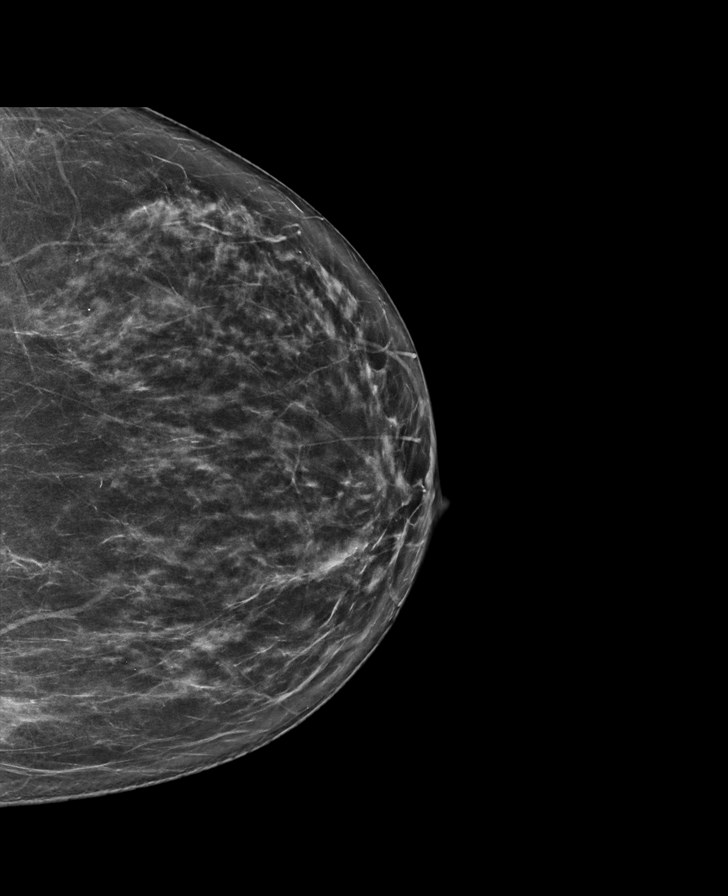

[R MLO tomo · tomo slice 43/86.0]
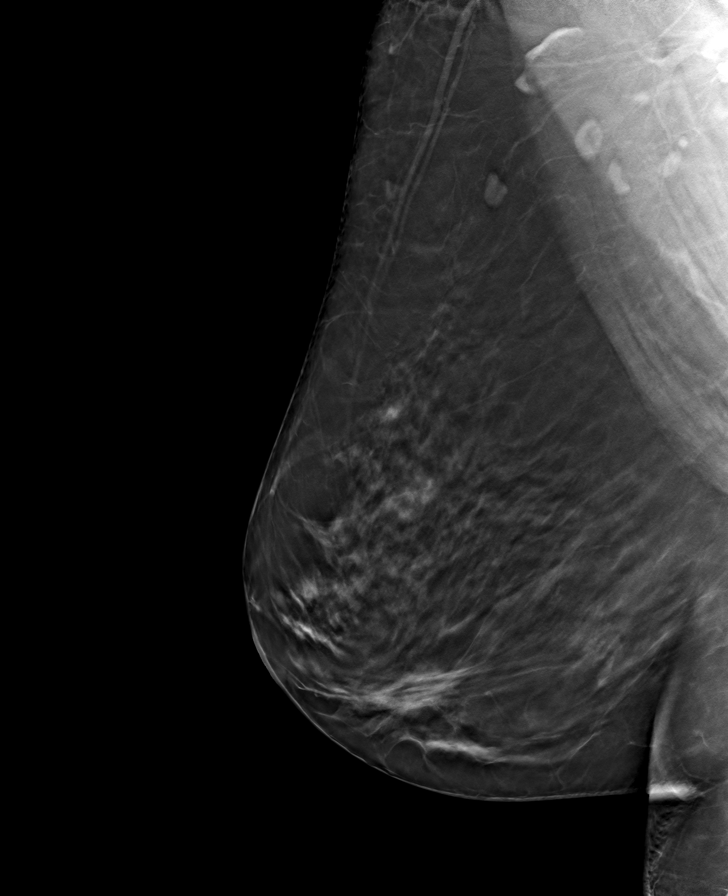

[L CC tomo · tomo slice 36/71.0]
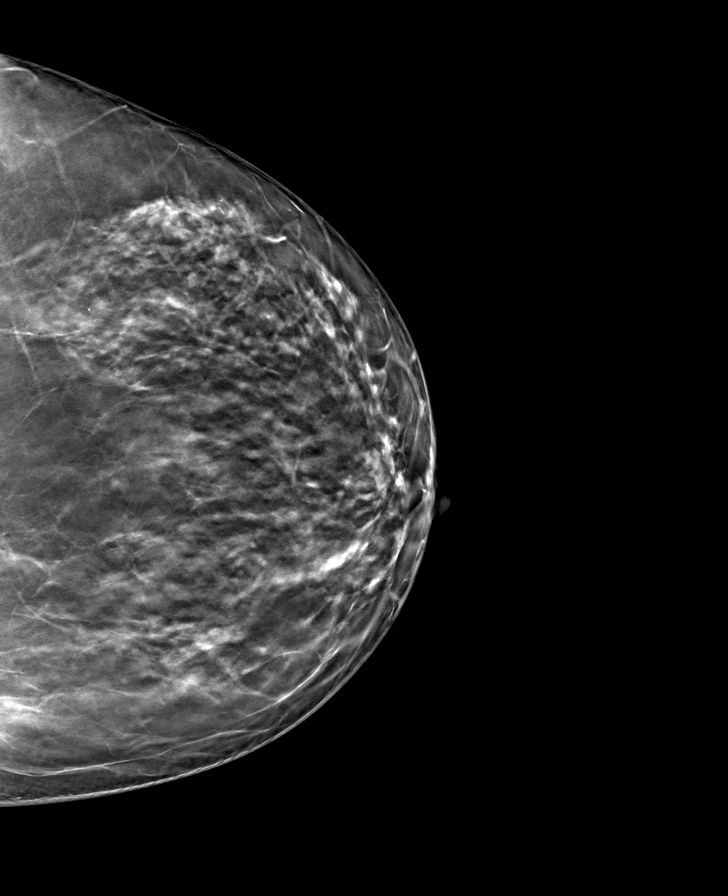

[L MLO tomo · tomo slice 45/88.0]
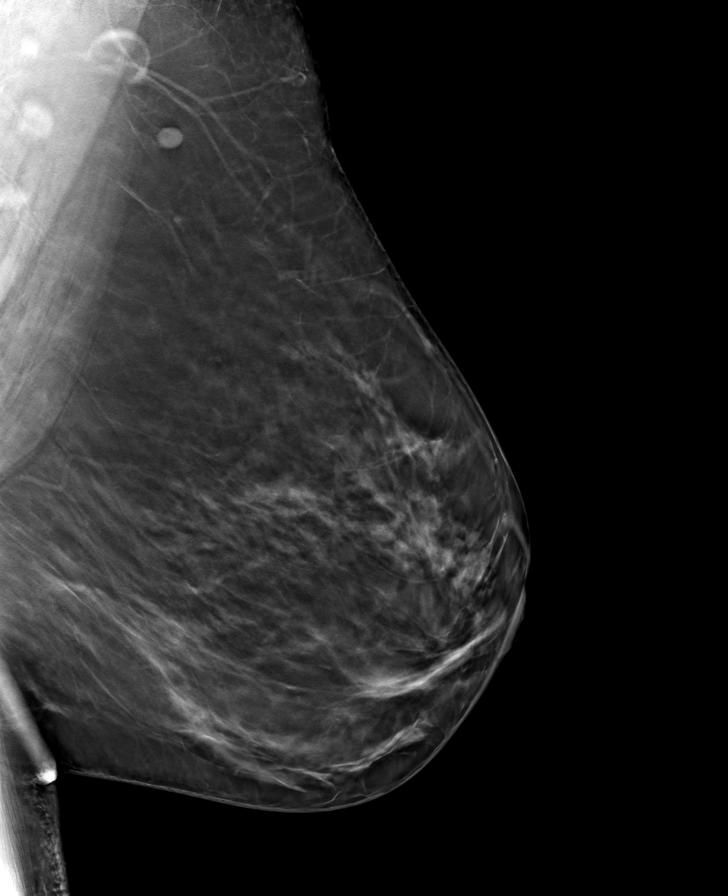

[R CC tomo · tomo slice 39/76.0]
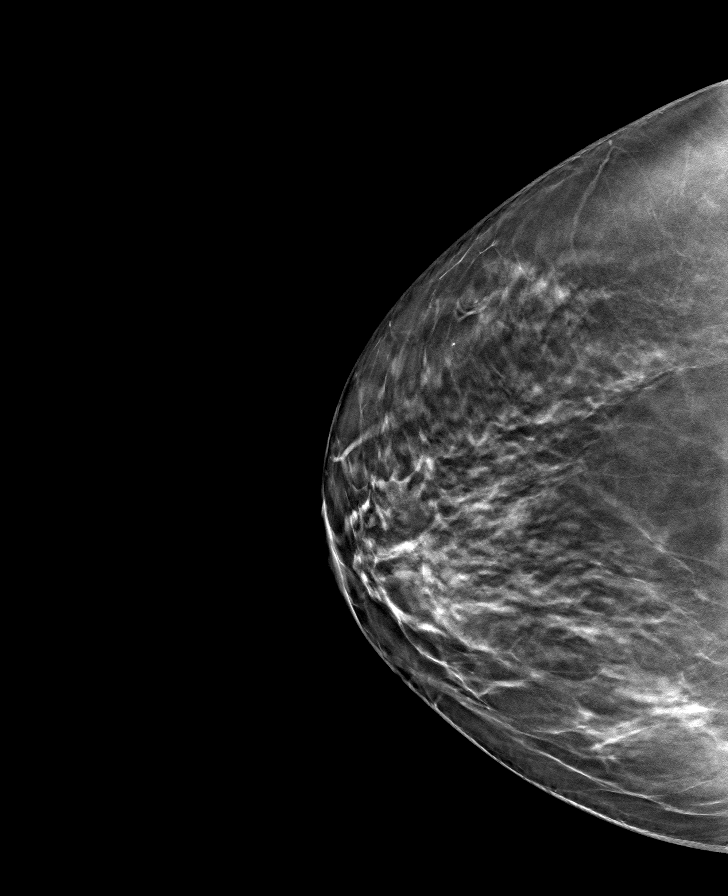

[8 of 24 positions shown; findings below may reference images not displayed]

ACR Breast Density Category b: There are scattered areas of
fibroglandular density.
FINDINGS: There are no findings suspicious for malignancy.
IMPRESSION: No mammographic evidence of malignancy. A result letter of this
screening mammogram will be mailed directly to the patient.

RECOMMENDATION:
Screening mammogram in one year. (Code:51-O-LD2)

BI-RADS CATEGORY  1: Negative.

## 2023-05-18 DIAGNOSIS — G4733 Obstructive sleep apnea (adult) (pediatric): Secondary | ICD-10-CM | POA: Diagnosis not present

## 2023-05-20 DIAGNOSIS — I10 Essential (primary) hypertension: Secondary | ICD-10-CM | POA: Diagnosis not present

## 2023-05-20 DIAGNOSIS — E669 Obesity, unspecified: Secondary | ICD-10-CM | POA: Diagnosis not present

## 2023-05-20 DIAGNOSIS — F5104 Psychophysiologic insomnia: Secondary | ICD-10-CM | POA: Diagnosis not present

## 2023-05-20 DIAGNOSIS — G4733 Obstructive sleep apnea (adult) (pediatric): Secondary | ICD-10-CM | POA: Diagnosis not present

## 2023-05-21 DIAGNOSIS — M533 Sacrococcygeal disorders, not elsewhere classified: Secondary | ICD-10-CM | POA: Diagnosis not present

## 2023-06-02 DIAGNOSIS — E559 Vitamin D deficiency, unspecified: Secondary | ICD-10-CM | POA: Diagnosis not present

## 2023-06-28 DIAGNOSIS — E669 Obesity, unspecified: Secondary | ICD-10-CM | POA: Diagnosis not present

## 2023-06-28 DIAGNOSIS — G4733 Obstructive sleep apnea (adult) (pediatric): Secondary | ICD-10-CM | POA: Diagnosis not present

## 2023-06-28 DIAGNOSIS — I10 Essential (primary) hypertension: Secondary | ICD-10-CM | POA: Diagnosis not present

## 2023-06-28 DIAGNOSIS — F5104 Psychophysiologic insomnia: Secondary | ICD-10-CM | POA: Diagnosis not present

## 2023-07-01 DIAGNOSIS — L723 Sebaceous cyst: Secondary | ICD-10-CM | POA: Diagnosis not present

## 2023-07-01 DIAGNOSIS — D225 Melanocytic nevi of trunk: Secondary | ICD-10-CM | POA: Diagnosis not present

## 2023-07-01 DIAGNOSIS — L910 Hypertrophic scar: Secondary | ICD-10-CM | POA: Diagnosis not present

## 2023-08-30 DIAGNOSIS — I10 Essential (primary) hypertension: Secondary | ICD-10-CM | POA: Diagnosis not present

## 2023-08-30 DIAGNOSIS — E559 Vitamin D deficiency, unspecified: Secondary | ICD-10-CM | POA: Diagnosis not present

## 2023-08-30 DIAGNOSIS — R946 Abnormal results of thyroid function studies: Secondary | ICD-10-CM | POA: Diagnosis not present

## 2023-08-30 DIAGNOSIS — E785 Hyperlipidemia, unspecified: Secondary | ICD-10-CM | POA: Diagnosis not present

## 2023-08-30 DIAGNOSIS — R7989 Other specified abnormal findings of blood chemistry: Secondary | ICD-10-CM | POA: Diagnosis not present

## 2023-09-01 ENCOUNTER — Other Ambulatory Visit (HOSPITAL_COMMUNITY): Payer: Self-pay | Admitting: Family Medicine

## 2023-09-01 DIAGNOSIS — F410 Panic disorder [episodic paroxysmal anxiety] without agoraphobia: Secondary | ICD-10-CM | POA: Diagnosis not present

## 2023-09-01 DIAGNOSIS — Z0000 Encounter for general adult medical examination without abnormal findings: Secondary | ICD-10-CM | POA: Diagnosis not present

## 2023-09-01 DIAGNOSIS — Z23 Encounter for immunization: Secondary | ICD-10-CM | POA: Diagnosis not present

## 2023-09-01 DIAGNOSIS — E785 Hyperlipidemia, unspecified: Secondary | ICD-10-CM

## 2023-09-01 DIAGNOSIS — F3341 Major depressive disorder, recurrent, in partial remission: Secondary | ICD-10-CM | POA: Diagnosis not present

## 2023-09-08 ENCOUNTER — Ambulatory Visit (HOSPITAL_COMMUNITY)
Admission: RE | Admit: 2023-09-08 | Discharge: 2023-09-08 | Disposition: A | Discharge status: Home / Self Care | Payer: Self-pay | Source: Ambulatory Visit | Attending: Family Medicine | Admitting: Family Medicine

## 2023-09-08 DIAGNOSIS — E785 Hyperlipidemia, unspecified: Secondary | ICD-10-CM | POA: Insufficient documentation

## 2023-09-23 DIAGNOSIS — M533 Sacrococcygeal disorders, not elsewhere classified: Secondary | ICD-10-CM | POA: Diagnosis not present

## 2023-09-30 ENCOUNTER — Other Ambulatory Visit: Payer: Self-pay | Admitting: Family Medicine

## 2023-09-30 DIAGNOSIS — Z1231 Encounter for screening mammogram for malignant neoplasm of breast: Secondary | ICD-10-CM

## 2023-10-08 ENCOUNTER — Ambulatory Visit
Admission: RE | Admit: 2023-10-08 | Discharge: 2023-10-08 | Disposition: A | Discharge status: Home / Self Care | Payer: BC Managed Care – PPO | Source: Ambulatory Visit | Attending: Family Medicine | Admitting: Family Medicine

## 2023-10-08 DIAGNOSIS — Z1231 Encounter for screening mammogram for malignant neoplasm of breast: Secondary | ICD-10-CM | POA: Diagnosis not present

## 2023-11-25 DIAGNOSIS — G4733 Obstructive sleep apnea (adult) (pediatric): Secondary | ICD-10-CM | POA: Diagnosis not present

## 2023-11-25 DIAGNOSIS — Z4542 Encounter for adjustment and management of neuropacemaker (brain) (peripheral nerve) (spinal cord): Secondary | ICD-10-CM | POA: Diagnosis not present

## 2023-11-25 DIAGNOSIS — F5104 Psychophysiologic insomnia: Secondary | ICD-10-CM | POA: Diagnosis not present

## 2023-12-15 DIAGNOSIS — M533 Sacrococcygeal disorders, not elsewhere classified: Secondary | ICD-10-CM | POA: Diagnosis not present

## 2023-12-23 DIAGNOSIS — F5104 Psychophysiologic insomnia: Secondary | ICD-10-CM | POA: Diagnosis not present

## 2023-12-23 DIAGNOSIS — G4733 Obstructive sleep apnea (adult) (pediatric): Secondary | ICD-10-CM | POA: Diagnosis not present

## 2023-12-23 DIAGNOSIS — Z4542 Encounter for adjustment and management of neuropacemaker (brain) (peripheral nerve) (spinal cord): Secondary | ICD-10-CM | POA: Diagnosis not present

## 2024-01-11 DIAGNOSIS — G4710 Hypersomnia, unspecified: Secondary | ICD-10-CM | POA: Diagnosis not present

## 2024-03-01 ENCOUNTER — Other Ambulatory Visit: Payer: Self-pay | Admitting: Family Medicine

## 2024-03-01 DIAGNOSIS — R911 Solitary pulmonary nodule: Secondary | ICD-10-CM

## 2024-03-06 DIAGNOSIS — R7301 Impaired fasting glucose: Secondary | ICD-10-CM | POA: Diagnosis not present

## 2024-03-06 DIAGNOSIS — E785 Hyperlipidemia, unspecified: Secondary | ICD-10-CM | POA: Diagnosis not present

## 2024-03-06 DIAGNOSIS — R7989 Other specified abnormal findings of blood chemistry: Secondary | ICD-10-CM | POA: Diagnosis not present

## 2024-03-07 DIAGNOSIS — L821 Other seborrheic keratosis: Secondary | ICD-10-CM | POA: Diagnosis not present

## 2024-03-07 DIAGNOSIS — D2262 Melanocytic nevi of left upper limb, including shoulder: Secondary | ICD-10-CM | POA: Diagnosis not present

## 2024-03-07 DIAGNOSIS — L858 Other specified epidermal thickening: Secondary | ICD-10-CM | POA: Diagnosis not present

## 2024-03-07 DIAGNOSIS — D224 Melanocytic nevi of scalp and neck: Secondary | ICD-10-CM | POA: Diagnosis not present

## 2024-03-07 DIAGNOSIS — C44619 Basal cell carcinoma of skin of left upper limb, including shoulder: Secondary | ICD-10-CM | POA: Diagnosis not present

## 2024-03-09 ENCOUNTER — Ambulatory Visit
Admission: RE | Admit: 2024-03-09 | Discharge: 2024-03-09 | Disposition: A | Discharge status: Home / Self Care | Source: Ambulatory Visit | Attending: Family Medicine | Admitting: Family Medicine

## 2024-03-09 ENCOUNTER — Encounter: Payer: Self-pay | Admitting: Radiology

## 2024-03-09 DIAGNOSIS — R599 Enlarged lymph nodes, unspecified: Secondary | ICD-10-CM | POA: Diagnosis not present

## 2024-03-09 DIAGNOSIS — R918 Other nonspecific abnormal finding of lung field: Secondary | ICD-10-CM | POA: Diagnosis not present

## 2024-03-09 DIAGNOSIS — R911 Solitary pulmonary nodule: Secondary | ICD-10-CM

## 2024-03-13 DIAGNOSIS — I10 Essential (primary) hypertension: Secondary | ICD-10-CM | POA: Diagnosis not present

## 2024-03-13 DIAGNOSIS — F410 Panic disorder [episodic paroxysmal anxiety] without agoraphobia: Secondary | ICD-10-CM | POA: Diagnosis not present

## 2024-03-13 DIAGNOSIS — F331 Major depressive disorder, recurrent, moderate: Secondary | ICD-10-CM | POA: Diagnosis not present

## 2024-03-13 DIAGNOSIS — F1090 Alcohol use, unspecified, uncomplicated: Secondary | ICD-10-CM | POA: Diagnosis not present

## 2024-03-31 DIAGNOSIS — M533 Sacrococcygeal disorders, not elsewhere classified: Secondary | ICD-10-CM | POA: Diagnosis not present

## 2024-06-15 ENCOUNTER — Ambulatory Visit: Admitting: Emergency Medicine

## 2024-06-15 ENCOUNTER — Encounter: Payer: Self-pay | Admitting: Emergency Medicine

## 2024-06-15 VITALS — BP 122/78 | HR 91 | Temp 97.8°F | Ht 63.0 in | Wt 212.8 lb

## 2024-06-15 DIAGNOSIS — R918 Other nonspecific abnormal finding of lung field: Secondary | ICD-10-CM | POA: Diagnosis not present

## 2024-06-15 DIAGNOSIS — R053 Chronic cough: Secondary | ICD-10-CM | POA: Insufficient documentation

## 2024-06-15 DIAGNOSIS — R590 Localized enlarged lymph nodes: Secondary | ICD-10-CM | POA: Diagnosis not present

## 2024-06-15 DIAGNOSIS — J45990 Exercise induced bronchospasm: Secondary | ICD-10-CM

## 2024-06-15 LAB — CBC WITH DIFFERENTIAL/PLATELET
Basophils Absolute: 0 K/uL (ref 0.0–0.1)
Basophils Relative: 0.9 % (ref 0.0–3.0)
Eosinophils Absolute: 0.1 K/uL (ref 0.0–0.7)
Eosinophils Relative: 1.6 % (ref 0.0–5.0)
HCT: 40.2 % (ref 36.0–46.0)
Hemoglobin: 13.3 g/dL (ref 12.0–15.0)
Lymphocytes Relative: 25.3 % (ref 12.0–46.0)
Lymphs Abs: 1.4 K/uL (ref 0.7–4.0)
MCHC: 33.2 g/dL (ref 30.0–36.0)
MCV: 90.3 fl (ref 78.0–100.0)
Monocytes Absolute: 0.3 K/uL (ref 0.1–1.0)
Monocytes Relative: 5.3 % (ref 3.0–12.0)
Neutro Abs: 3.6 K/uL (ref 1.4–7.7)
Neutrophils Relative %: 66.9 % (ref 43.0–77.0)
Platelets: 301 K/uL (ref 150.0–400.0)
RBC: 4.45 Mil/uL (ref 3.87–5.11)
RDW: 14.2 % (ref 11.5–15.5)
WBC: 5.4 K/uL (ref 4.0–10.5)

## 2024-06-15 NOTE — Progress Notes (Signed)
 Subjective:    Patient ID: Veronica Meyer, female    DOB: Dec 02, 1970, 53 y.o.   MRN: [id number]  HPI Discussed the use of AI scribe software for clinical note transcription with the patient, who gave verbal consent to proceed.  History of Present Illness [name] is a 53 year old female who presents with pulmonary nodules and lymphadenopathy noted on CT scan.  Pulmonary nodules and lymphadenopathy were identified on a CT scan of the chest. A cardiac scoring CT performed on September 08, 2023, revealed numerous small scattered mediastinal nodes, some partially calcified, with the largest measuring 11 mm in the subcarinal region. Additionally, there were fissural nodules in the right major fissure measuring 9 mm and other scattered small subpleural nodules bilaterally. A follow-up CT chest on March 09, 2024, showed a similar appearance with bilateral scattered pulmonary nodules up to 4 mm and a larger flat visceral nodule in the right major fissure approximately 9 mm.  They have a history of tobacco use and quit smoking a year and a half ago. Despite quitting, they experiences a persistent cough that has not improved as expected. The cough is sometimes productive, but they choose not to expel the mucus. They have a history of exercise-induced asthma but are not on medication for it. They do yard work and are exposed to various types of mushrooms and mold, which they speculate could be related to their symptoms. The cough has been persistent since college, worsening with smoking and improving when they quit, although not significantly this time.  They have a history of allergies and are on allergy medications but still experience daily and nightly drainage and congestion. They have started taking Gaviscon at night for possible silent reflux, which has improved nighttime symptoms.  They have a history of obstructive sleep apnea (OSA) but are not using CPAP. They recently underwent  placement of a hypoglossal nerve stimulator in March 2024. They report knee and hip problems that vary depending on the day, but no history of skin rashes consistent with sarcoidosis. They had a cancerous lesion removed from their arm, but it was not related to sarcoidosis.    Results RADIOLOGY Cardiac scoring CT: Numerous small scattered mediastinal nodes, some partially calcified, largest 11 mm in the subcarinal region. Fissural nodule in the right major fissure, 9 mm, other scattered small subpleural nodules bilaterally. (09/08/2023)  CT chest: Bilateral scattered pulmonary nodules up to 4 mm and a larger flat visceral nodule in the right major fissure approximately 9 mm, unchanged compared with December 2024. (03/09/2024)    Review of Systems As per HPI  Past Medical History:  Diagnosis Date   Anxiety    Arthritis    Asthma    exercise induced asthma - no inhaler   Depression    Headache    History of kidney stones    passed stones   History of seasonal allergies    Hypertension    Sleep apnea    does not use cpap    Family History  Problem Relation Age of Onset   Breast cancer Maternal Grandmother      Social History   Socioeconomic History   Marital status: Married    Spouse name: Not on file   Number of children: Not on file   Years of education: Not on file   Highest education level: Not on file  Occupational History   Not on file  Tobacco Use   Smoking status: Former  Current packs/day: 0.50    Average packs/day: 0.5 packs/day for 17.0 years (8.5 ttl pk-yrs)    Types: Cigarettes   Smokeless tobacco: Former    Quit date: 11/23/2022  Vaping Use   Vaping status: Never Used  Substance and Sexual Activity   Alcohol use: Yes    Alcohol/week: 9.0 standard drinks of alcohol    Types: 9 Standard drinks or equivalent per week    Comment: Beer/Gin   Drug use: Never   Sexual activity: Yes    Birth control/protection: None  Other Topics Concern   Not on file   Social History Narrative   Not on file   Social Drivers of Health   Financial Resource Strain: Not on file  Food Insecurity: Not on file  Transportation Needs: Not on file  Physical Activity: Not on file  Stress: Not on file  Social Connections: Not on file  Intimate Partner Violence: Not on file    Allergies  Allergen Reactions   Penicillins Rash    Childhood reaction.   Ambien [Zolpidem]     Paradoxical reaction   Pneumococcal Vaccines Hives and Other (See Comments)    Hives/sensation of throat swelling up    Current Outpatient Medications on File Prior to Visit  Medication Sig Dispense Refill   acetaminophen  (TYLENOL ) 500 MG tablet Take 500-1,000 mg by mouth every 6 (six) hours as needed (pain.).     buPROPion (WELLBUTRIN XL) 150 MG 24 hr tablet Take 150 mg by mouth daily.     cetirizine (ZYRTEC) 10 MG tablet Take 10 mg by mouth in the morning.     clonazePAM (KLONOPIN) 0.5 MG tablet Take 0.5 mg by mouth at bedtime as needed (sleep).     diclofenac Sodium (VOLTAREN) 1 % GEL Apply 2 g topically 4 (four) times daily as needed (pain.).     fluticasone (FLONASE) 50 MCG/ACT nasal spray Place 1 spray into both nostrils daily in the afternoon.     hydrochlorothiazide (HYDRODIURIL) 12.5 MG tablet Take 12.5 mg by mouth every morning.     sertraline (ZOLOFT) 100 MG tablet Take 100 mg by mouth in the morning.     HYDROcodone -acetaminophen  (NORCO) 5-325 MG tablet Take 1-2 tablets by mouth every 6 (six) hours as needed for moderate pain. (Patient not taking: Reported on 06/15/2024) 12 tablet 0   losartan-hydrochlorothiazide (HYZAAR) 50-12.5 MG tablet Take 1 tablet by mouth in the morning. (Patient not taking: Reported on 06/15/2024)     omeprazole (PRILOSEC) 20 MG capsule Take 20 mg by mouth daily. (Patient not taking: Reported on 06/15/2024)     No current facility-administered medications on file prior to visit.       Objective:    Vitals:   06/15/24 1319  BP: 122/78  Pulse:  91  Temp: 97.8 F (36.6 C)  TempSrc: Temporal  SpO2: 100%  Weight: 212 lb 12.8 oz (96.5 kg)  Height: 5' 3 (1.6 m)   Physical Exam Gen: Pleasant, well-nourished, in no distress,  normal affect  ENT: No lesions,  mouth clear,  oropharynx clear, no postnasal drip  Neck: No JVD, no stridor  Lungs: No use of accessory muscles, no crackles or wheezing on normal respiration, no wheeze on forced expiration  Cardiovascular: RRR, heart sounds normal, no murmur or gallops, no peripheral edema  Musculoskeletal: No deformities, no cyanosis or clubbing  Neuro: alert, awake, non focal  Skin: Warm, no lesions or rashes       Assessment & Plan:   Assessment &  Plan Pulmonary nodules/lesions, multiple  Chronic cough   Assessment and Plan Assessment & Plan Pulmonary nodules and mediastinal lymphadenopathy Bilateral pulmonary nodules and mediastinal lymphadenopathy unchanged since December 2024. Differential includes sarcoidosis, mycobacterial infection, fungal exposure, less likely lymphoma due to calcifications. - Order blood tests including ACE level and hypersensitive pneumonitis panel, RF, ANCA. - Schedule repeat CT chest in December 2025. - Consider bronchoscopy for lymph node biopsy if indicated by test results or clinical changes.  Chronic cough Chronic cough since college, worsened with smoking, unresolved post-cessation. Possible causes: allergic rhinitis, silent reflux, asthma, tobacco-related lung disease. Discussed multifactorial nature and need for further evaluation. - Order pulmonary function tests to assess for airflow obstruction or asthma. - Perform lab work to evaluate potential causes of cough.  Allergic rhinitis Persistent nasal drainage and congestion despite allergy medications. Possible contribution to chronic cough.  Exercise-induced asthma History of exercise-induced asthma, currently not on medication.  Tobacco use Cessation 1.5 years ago. Persistent  cough despite quitting. Discussed potential tobacco-related lung disease as a contributing factor.  Follow-up Discussed follow-up plan to evaluate test results and monitor condition. - Schedule follow-up appointment in December 2025 after CT chest and lab results are available. - Consider earlier follow-up if lab results indicate need for discussion.     Return in about 3 months (around 09/14/2024) for With Dr. Shelah, To review CT scan of the chest.  Lamar Shelah, MD, PhD 06/15/2024, 8:46 PM [facility] [phone] or if no answer before 7:00PM call [phone] For any issues after 7:00PM please call eLink [phone]

## 2024-06-15 NOTE — Patient Instructions (Signed)
  VISIT SUMMARY: During your visit, we discussed the findings of pulmonary nodules and lymphadenopathy noted on your CT scans, as well as your persistent cough, history of allergies, and other related health concerns. We reviewed your history of tobacco use, exercise-induced asthma, and obstructive sleep apnea. We also discussed your exposure to environmental factors such as mushrooms and mold, which may be contributing to your symptoms.  YOUR PLAN: -PULMONARY NODULES AND MEDIASTINAL LYMPHADENOPATHY: Pulmonary nodules are small growths in the lungs, and mediastinal lymphadenopathy refers to enlarged lymph nodes in the chest area. These findings have remained unchanged since December 2024. We will order blood tests, including an ACE level and a hypersensitive pneumonitis panel, and schedule a repeat CT scan of your chest in December 2025. If necessary, we may consider a bronchoscopy to biopsy the lymph nodes based on test results or any changes in your condition.  -CHRONIC COUGH: A chronic cough is a cough that persists over a long period. Your cough has been ongoing since college and has not resolved even after quitting smoking. We discussed that the cough could be due to multiple factors, including allergies, silent reflux, asthma, or tobacco-related lung disease. We will order pulmonary function tests to check for airflow obstruction or asthma and perform lab work to identify potential causes of your cough.  -ALLERGIC RHINITIS: Allergic rhinitis is inflammation of the nasal passages due to allergies, causing symptoms like nasal drainage and congestion. Despite taking allergy medications, you still experience these symptoms, which may be contributing to your chronic cough.  -TOBACCO USE: You quit smoking 1.5 years ago, but your persistent cough may still be related to previous tobacco use. We discussed the possibility of tobacco-related lung disease as a contributing factor.  INSTRUCTIONS: Please  follow up with us  in December 2025 after your CT chest and lab results are available. If the lab results indicate the need for an earlier discussion, we will schedule an earlier follow-up appointment.

## 2024-06-16 ENCOUNTER — Ambulatory Visit: Payer: Self-pay | Admitting: Emergency Medicine

## 2024-06-19 LAB — HYPERSENSITIVITY PNEUMONITIS
A. Pullulans Abs: NEGATIVE
A.Fumigatus #1 Abs: NEGATIVE
Micropolyspora faeni, IgG: NEGATIVE
Pigeon Serum Abs: NEGATIVE
Thermoact. Saccharii: NEGATIVE
Thermoactinomyces vulgaris, IgG: NEGATIVE

## 2024-06-19 NOTE — Progress Notes (Signed)
 Pt is aware of results. Will followup in December. Nothing further needed.

## 2024-06-20 LAB — ANGIOTENSIN CONVERTING ENZYME: Angiotensin-Converting Enzyme: 70 U/L — ABNORMAL HIGH (ref 9–67)

## 2024-06-20 LAB — ANCA SCREEN W REFLEX TITER: ANCA SCREEN: NEGATIVE

## 2024-06-20 LAB — RHEUMATOID FACTOR: Rheumatoid fact SerPl-aCnc: <10 [IU]/mL (ref <14)

## 2024-07-03 DIAGNOSIS — M722 Plantar fascial fibromatosis: Secondary | ICD-10-CM | POA: Diagnosis not present

## 2024-07-03 DIAGNOSIS — Z23 Encounter for immunization: Secondary | ICD-10-CM | POA: Diagnosis not present

## 2024-07-03 DIAGNOSIS — R946 Abnormal results of thyroid function studies: Secondary | ICD-10-CM | POA: Diagnosis not present

## 2024-07-03 DIAGNOSIS — M2550 Pain in unspecified joint: Secondary | ICD-10-CM | POA: Diagnosis not present

## 2024-07-03 DIAGNOSIS — R7989 Other specified abnormal findings of blood chemistry: Secondary | ICD-10-CM | POA: Diagnosis not present

## 2024-07-03 DIAGNOSIS — R202 Paresthesia of skin: Secondary | ICD-10-CM | POA: Diagnosis not present

## 2024-07-06 DIAGNOSIS — F5104 Psychophysiologic insomnia: Secondary | ICD-10-CM | POA: Diagnosis not present

## 2024-07-06 DIAGNOSIS — G4733 Obstructive sleep apnea (adult) (pediatric): Secondary | ICD-10-CM | POA: Diagnosis not present

## 2024-07-06 DIAGNOSIS — Z4542 Encounter for adjustment and management of neuropacemaker (brain) (peripheral nerve) (spinal cord): Secondary | ICD-10-CM | POA: Diagnosis not present

## 2024-07-06 DIAGNOSIS — E669 Obesity, unspecified: Secondary | ICD-10-CM | POA: Diagnosis not present

## 2024-07-12 DIAGNOSIS — M25561 Pain in right knee: Secondary | ICD-10-CM | POA: Diagnosis not present

## 2024-07-12 DIAGNOSIS — M2241 Chondromalacia patellae, right knee: Secondary | ICD-10-CM | POA: Diagnosis not present

## 2024-07-12 DIAGNOSIS — M23306 Other meniscus derangements, unspecified meniscus, right knee: Secondary | ICD-10-CM | POA: Diagnosis not present

## 2024-07-17 DIAGNOSIS — M533 Sacrococcygeal disorders, not elsewhere classified: Secondary | ICD-10-CM | POA: Diagnosis not present

## 2024-08-10 ENCOUNTER — Ambulatory Visit
Admission: RE | Admit: 2024-08-10 | Discharge: 2024-08-10 | Disposition: A | Discharge status: Home / Self Care | Source: Ambulatory Visit | Attending: Emergency Medicine | Admitting: Emergency Medicine

## 2024-08-10 DIAGNOSIS — R918 Other nonspecific abnormal finding of lung field: Secondary | ICD-10-CM | POA: Diagnosis not present

## 2024-08-15 ENCOUNTER — Ambulatory Visit (INDEPENDENT_AMBULATORY_CARE_PROVIDER_SITE_OTHER)

## 2024-08-15 DIAGNOSIS — R053 Chronic cough: Secondary | ICD-10-CM | POA: Diagnosis not present

## 2024-08-15 LAB — PULMONARY FUNCTION TEST
DL/VA % pred: 100 %
DL/VA: 4.34 ml/min/mmHg/L
DLCO unc % pred: 91 %
DLCO unc: 18.79 ml/min/mmHg
FEF 25-75 Post: 3.05 L/s
FEF 25-75 Pre: 3.23 L/s
FEF2575-%Change-Post: -5 %
FEF2575-%Pred-Post: 113 %
FEF2575-%Pred-Pre: 120 %
FEV1-%Change-Post: -1 %
FEV1-%Pred-Post: 96 %
FEV1-%Pred-Pre: 98 %
FEV1-Post: 2.62 L
FEV1-Pre: 2.66 L
FEV1FVC-%Change-Post: 2 %
FEV1FVC-%Pred-Pre: 105 %
FEV6-%Change-Post: -3 %
FEV6-%Pred-Post: 91 %
FEV6-%Pred-Pre: 94 %
FEV6-Post: 3.05 L
FEV6-Pre: 3.16 L
FEV6FVC-%Pred-Post: 102 %
FEV6FVC-%Pred-Pre: 102 %
FVC-%Change-Post: -3 %
FVC-%Pred-Post: 89 %
FVC-%Pred-Pre: 92 %
FVC-Post: 3.05 L
FVC-Pre: 3.16 L
Post FEV1/FVC ratio: 86 %
Post FEV6/FVC ratio: 100 %
Pre FEV1/FVC ratio: 84 %
Pre FEV6/FVC Ratio: 100 %
RV % pred: 81 %
RV: 1.43 L
TLC % pred: 91 %
TLC: 4.51 L

## 2024-08-15 NOTE — Progress Notes (Signed)
 Full pft performed today

## 2024-08-15 NOTE — Patient Instructions (Signed)
 Full pft performed today

## 2024-08-21 ENCOUNTER — Encounter

## 2024-08-23 ENCOUNTER — Encounter: Payer: Self-pay | Admitting: Emergency Medicine

## 2024-08-23 ENCOUNTER — Ambulatory Visit: Admitting: Emergency Medicine

## 2024-08-23 VITALS — BP 126/74 | HR 85 | Temp 97.7°F | Ht 63.0 in | Wt 209.6 lb

## 2024-08-23 DIAGNOSIS — K0510 Chronic gingivitis, plaque induced: Secondary | ICD-10-CM | POA: Insufficient documentation

## 2024-08-23 DIAGNOSIS — R918 Other nonspecific abnormal finding of lung field: Secondary | ICD-10-CM | POA: Diagnosis not present

## 2024-08-23 DIAGNOSIS — J45990 Exercise induced bronchospasm: Secondary | ICD-10-CM

## 2024-08-23 DIAGNOSIS — R590 Localized enlarged lymph nodes: Secondary | ICD-10-CM | POA: Diagnosis not present

## 2024-08-23 DIAGNOSIS — R053 Chronic cough: Secondary | ICD-10-CM | POA: Diagnosis not present

## 2024-08-23 DIAGNOSIS — M130 Polyarthritis, unspecified: Secondary | ICD-10-CM | POA: Insufficient documentation

## 2024-08-23 DIAGNOSIS — G4733 Obstructive sleep apnea (adult) (pediatric): Secondary | ICD-10-CM

## 2024-08-23 NOTE — Assessment & Plan Note (Addendum)
 Pulmonary function testing reassuring without any evidence of obstruction.  No indication for maintenance BD therapy or ICS at this time.  She could have an exercise induced component.  Appropriate to have albuterol available to use if needed

## 2024-08-23 NOTE — Progress Notes (Signed)
 Subjective:    Patient ID: Veronica Meyer, female    DOB: May 31, 1971, 53 y.o.   MRN: [id number]  HPI Discussed the use of AI scribe software for clinical note transcription with the patient, who gave verbal consent to proceed.  History of Present Illness Veronica Meyer Veronica Meyer is a 53 year old female who presents with pulmonary nodules and lymphadenopathy noted on CT scan.   ROV 08/23/2024 --this is a follow-up visit for pleasant 53 year old woman, former smoker, with a history of lymphadenopathy and pulmonary nodular disease on CT scan of the chest, chronic cough, chronic allergic rhinitis, suspected exercise-induced asthma.  We performed pulmonary function testing, repeat CT scan of the chest as below. She reports that since I last saw her she went through a 3 weeks spell of bilateral joint pain - B hips, knees. Was associated with some B swelling.  Her breathing has been doing ok. She does have occasional cough. She also has   Results  06/15/2024: RF > negative, ACE level 70, HSP panel negative, eosinophil 1.6% (0.1).   Pulmonary function testing 08/15/2024 reviewed by me, shows normal airflows without a significant bronchodilator response, normal lung volumes and normal diffusion capacity.  CT scan of the chest 08/10/2024 reviewed by me, shows stable borderline enlarged mediastinal adenopathy including an 11 mm subcarinal node.  There are associated coarse calcifications suggestive of possible granulomatous disease.  Bilateral nodules including stable 4 mm left upper lobe, stable 3 mm left upper lobe, 4 mm right upper lobe (possibly enlarged from 3 mm), stable 4 mm right upper lobe.  Overall unchanged   Review of Systems As per HPI  Past Medical History:  Diagnosis Date   Anxiety    Arthritis    Asthma    exercise induced asthma - no inhaler   Depression    Headache    History of kidney stones    passed stones   History of seasonal allergies    Hypertension    Sleep apnea     does not use cpap    Family History  Problem Relation Age of Onset   Breast cancer Maternal Grandmother      Social History   Socioeconomic History   Marital status: Married    Spouse name: Not on file   Number of children: Not on file   Years of education: Not on file   Highest education level: Not on file  Occupational History   Not on file  Tobacco Use   Smoking status: Former    Current packs/day: 0.50    Average packs/day: 0.5 packs/day for 17.0 years (8.5 ttl pk-yrs)    Types: Cigarettes   Smokeless tobacco: Former    Quit date: 11/23/2022  Vaping Use   Vaping status: Never Used  Substance and Sexual Activity   Alcohol use: Yes    Alcohol/week: 9.0 standard drinks of alcohol    Types: 9 Standard drinks or equivalent per week    Comment: Beer/Gin   Drug use: Never   Sexual activity: Yes    Birth control/protection: None  Other Topics Concern   Not on file  Social History Narrative   Not on file   Social Drivers of Health   Financial Resource Strain: Not on file  Food Insecurity: Not on file  Transportation Needs: Not on file  Physical Activity: Not on file  Stress: Not on file  Social Connections: Not on file  Intimate Partner Violence: Not on file    Allergies  Allergen Reactions   Penicillins Rash    Childhood reaction.   Ambien [Zolpidem]     Paradoxical reaction   Montelukast Swelling   Pneumococcal Vaccines Hives and Other (See Comments)    Hives/sensation of throat swelling up   Sumatriptan     Other Reaction(s): sense of throat swelling    Current Outpatient Medications on File Prior to Visit  Medication Sig Dispense Refill   azelastine (ASTELIN) 0.1 % nasal spray Place 2 sprays into both nostrils as needed.     buPROPion (WELLBUTRIN XL) 150 MG 24 hr tablet Take 150 mg by mouth daily.     cetirizine (ZYRTEC) 10 MG tablet Take 10 mg by mouth in the morning.     Cholecalciferol 50 MCG (2000 UT) CAPS Take 1 capsule by mouth daily.      clonazePAM (KLONOPIN) 0.5 MG tablet Take 0.5 mg by mouth at bedtime as needed (sleep).     hydrochlorothiazide (HYDRODIURIL) 12.5 MG tablet Take 12.5 mg by mouth every morning.     sertraline (ZOLOFT) 100 MG tablet Take 100 mg by mouth in the morning.     acetaminophen  (TYLENOL ) 500 MG tablet Take 500-1,000 mg by mouth every 6 (six) hours as needed (pain.). (Patient not taking: Reported on 08/23/2024)     diclofenac Sodium (VOLTAREN) 1 % GEL Apply 2 g topically 4 (four) times daily as needed (pain.). (Patient not taking: Reported on 08/23/2024)     fluticasone (FLONASE) 50 MCG/ACT nasal spray Place 1 spray into both nostrils daily in the afternoon. (Patient not taking: Reported on 08/23/2024)     HYDROcodone -acetaminophen  (NORCO) 5-325 MG tablet Take 1-2 tablets by mouth every 6 (six) hours as needed for moderate pain. (Patient not taking: Reported on 08/23/2024) 12 tablet 0   losartan-hydrochlorothiazide (HYZAAR) 50-12.5 MG tablet Take 1 tablet by mouth in the morning. (Patient not taking: Reported on 08/23/2024)     omeprazole (PRILOSEC) 20 MG capsule Take 20 mg by mouth daily. (Patient not taking: Reported on 08/23/2024)     traZODone (DESYREL) 50 MG tablet Take 50 mg by mouth at bedtime as needed.     No current facility-administered medications on file prior to visit.       Objective:    Vitals:   08/23/24 0938  BP: 126/74  Pulse: 85  Temp: 97.7 F (36.5 C)  SpO2: 95%  Weight: 209 lb 9.6 oz (95.1 kg)  Height: 5' 3 (1.6 m)   Physical Exam Gen: Pleasant, well-nourished, in no distress,  normal affect  ENT: No lesions,  mouth clear,  oropharynx clear, no postnasal drip  Neck: No JVD, no stridor  Lungs: No use of accessory muscles, no crackles or wheezing on normal respiration, no wheeze on forced expiration  Cardiovascular: RRR, heart sounds normal, no murmur or gallops, no peripheral edema  Musculoskeletal: No deformities, no cyanosis or clubbing  Neuro: alert, awake, non  focal  Skin: Warm, no lesions or rashes       Assessment & Plan:   Assessment & Plan Exercise-induced asthma Pulmonary function testing reassuring without any evidence of obstruction.  No indication for maintenance BD therapy or ICS at this time.  She could have an exercise induced component.  Appropriate to have albuterol available to use if needed Pulmonary nodules Small punctate pulmonary nodules, nonspecific.  All less than 4 mm.  No dedicated follow-up needed although I think we will be doing surveillance CTs given her mediastinal adenopathy, suspicion for possible systemic inflammatory process including sarcoidosis.  We can talk about the timing going forward Mediastinal adenopathy CT chest reassuring.  PFTs reassuring.  We did discuss the pros and cons of bronchoscopy and EBUS to evaluate her mediastinal adenopathy.  At this time I think it would probably be low yield since the nodes are small, calcified without any evidence of active inflammatory involvement.  For now we will hold off on bronchoscopy.  We will want to intermittently repeat her CT chest.  Will not schedule at this time, decide going forward based on clinical course when to perform. Chronic cough Adequate control currently. Obstructive sleep apnea Not on CPAP therapy Polyarthritis Unclear whether there is a unifying diagnosis here.  She has a slightly elevated ACE level, other inflammatory labs are negative.  She might still benefit from an evaluation by rheumatology depending on course. Gingivitis Gingival inflammation of unclear cause under evaluation by dentistry and dental surgery.  I have never seen a case of gingivitis associated with sarcoid but it has been case reported and is possible.  She is going to be evaluated for possible biopsy.  I did ask her to make sure that they pay special attention for possible granulomatous inflammation on those biopsies if they do get done.    Return in about 6 months (around  02/21/2025) for With Dr. Shelah.  I personally spent a total of 54 minutes in the care of the patient today including preparing to see the patient, getting/reviewing separately obtained history, performing a medically appropriate exam/evaluation, counseling and educating, documenting clinical information in the EHR, independently interpreting results, and communicating results.    Lamar Shelah, MD, PhD 08/23/2024, 10:26 AM [facility] [phone] or if no answer before 7:00PM call [phone] For any issues after 7:00PM please call eLink [phone]

## 2024-08-23 NOTE — Assessment & Plan Note (Addendum)
Not on CPAP therapy.

## 2024-08-23 NOTE — Assessment & Plan Note (Addendum)
 Gingival inflammation of unclear cause under evaluation by dentistry and dental surgery.  I have never seen a case of gingivitis associated with sarcoid but it has been case reported and is possible.  She is going to be evaluated for possible biopsy.  I did ask her to make sure that they pay special attention for possible granulomatous inflammation on those biopsies if they do get done.

## 2024-08-23 NOTE — Assessment & Plan Note (Addendum)
 Small punctate pulmonary nodules, nonspecific.  All less than 4 mm.  No dedicated follow-up needed although I think we will be doing surveillance CTs given her mediastinal adenopathy, suspicion for possible systemic inflammatory process including sarcoidosis.  We can talk about the timing going forward

## 2024-08-23 NOTE — Assessment & Plan Note (Addendum)
 Unclear whether there is a unifying diagnosis here.  She has a slightly elevated ACE level, other inflammatory labs are negative.  She might still benefit from an evaluation by rheumatology depending on course.

## 2024-08-23 NOTE — Assessment & Plan Note (Addendum)
 CT chest reassuring.  PFTs reassuring.  We did discuss the pros and cons of bronchoscopy and EBUS to evaluate her mediastinal adenopathy.  At this time I think it would probably be low yield since the nodes are small, calcified without any evidence of active inflammatory involvement.  For now we will hold off on bronchoscopy.  We will want to intermittently repeat her CT chest.  Will not schedule at this time, decide going forward based on clinical course when to perform.

## 2024-08-23 NOTE — Patient Instructions (Signed)
 We reviewed your pulmonary function testing today.  These showed normal airflows and were reassuring.  Good news. We reviewed your CT scan of the chest.  This showed stable small pulmonary nodules and stable calcified nodes in the mid chest.  Again good news.  We will discuss the timing of future repeat CT chest going forward depending on your clinical course. Agree with further evaluation for gingival inflammation as planned.  Because we have been talking about a possible diagnosis of sarcoidosis, any biopsies performed should be evaluated particularly for possible granulomatous inflammation. If you develop rash then you should be evaluated by dermatology for consideration of a biopsy to evaluate for sarcoidosis. You might benefit from evaluation by rheumatology even though your ANA, rheumatoid factor have been reassuring.  Please discuss this with your primary physician. Follow with Dr. Shelah in 6 months.  Please call sooner if you develop any new respiratory symptoms

## 2024-08-23 NOTE — Assessment & Plan Note (Addendum)
 Adequate control currently.
